# Patient Record
Sex: Male | Born: 1965 | Race: Black or African American | Hispanic: No | Marital: Single | Smoking: Current every day smoker
Health system: Southern US, Community
[De-identification: ages and names within clinical notes are randomized; demographics above are authoritative.]

## PROBLEM LIST (undated history)

## (undated) DIAGNOSIS — M545 Low back pain, unspecified: Secondary | ICD-10-CM

---

## 2005-12-02 ENCOUNTER — Emergency Department (HOSPITAL_COMMUNITY): Admission: EM | Admit: 2005-12-02 | Discharge: 2005-12-02 | Payer: Self-pay | Admitting: Emergency Medicine

## 2006-02-24 ENCOUNTER — Emergency Department (HOSPITAL_COMMUNITY): Admission: EM | Admit: 2006-02-24 | Discharge: 2006-02-24 | Payer: Self-pay | Admitting: Emergency Medicine

## 2007-03-10 ENCOUNTER — Emergency Department (HOSPITAL_COMMUNITY): Admission: EM | Admit: 2007-03-10 | Discharge: 2007-03-10 | Payer: Self-pay | Admitting: Emergency Medicine

## 2009-03-12 ENCOUNTER — Emergency Department (HOSPITAL_COMMUNITY): Admission: EM | Admit: 2009-03-12 | Discharge: 2009-03-12 | Payer: Self-pay | Admitting: Emergency Medicine

## 2009-08-24 ENCOUNTER — Emergency Department (HOSPITAL_COMMUNITY): Admission: EM | Admit: 2009-08-24 | Discharge: 2009-08-24 | Payer: Self-pay | Admitting: Emergency Medicine

## 2009-09-02 ENCOUNTER — Emergency Department (HOSPITAL_COMMUNITY): Admission: EM | Admit: 2009-09-02 | Discharge: 2009-09-02 | Payer: Self-pay | Admitting: Emergency Medicine

## 2010-03-28 LAB — BASIC METABOLIC PANEL
CO2: 22 mEq/L (ref 19–32)
Calcium: 8.8 mg/dL (ref 8.4–10.5)
Chloride: 103 mEq/L (ref 96–112)
Creatinine, Ser: 1.27 mg/dL (ref 0.4–1.5)
GFR calc non Af Amer: >60 mL/min (ref >60)
Glucose, Bld: 155 mg/dL — ABNORMAL HIGH (ref 70–99)
Potassium: 3.5 mEq/L (ref 3.5–5.1)

## 2010-03-28 LAB — URINALYSIS, ROUTINE W REFLEX MICROSCOPIC
Glucose, UA: NEGATIVE mg/dL
Nitrite: NEGATIVE
Specific Gravity, Urine: <1.005 — ABNORMAL LOW (ref 1.005–1.030)

## 2010-03-28 LAB — CBC
MCHC: 33 g/dL (ref 30.0–36.0)
RDW: 13.5 % (ref 11.5–15.5)
WBC: 4.4 10*3/uL (ref 4.0–10.5)

## 2010-03-28 LAB — URINE MICROSCOPIC-ADD ON

## 2010-03-28 LAB — DIFFERENTIAL
Monocytes Absolute: 0.6 10*3/uL (ref 0.1–1.0)
Monocytes Relative: 13 % — ABNORMAL HIGH (ref 3–12)
Neutrophils Relative %: 55 % (ref 43–77)

## 2010-03-28 LAB — RAPID URINE DRUG SCREEN, HOSP PERFORMED
Cocaine: POSITIVE — AB
Opiates: NOT DETECTED

## 2010-11-02 ENCOUNTER — Encounter: Payer: Self-pay | Admitting: Emergency Medicine

## 2010-11-02 ENCOUNTER — Emergency Department (HOSPITAL_COMMUNITY)
Admission: EM | Admit: 2010-11-02 | Discharge: 2010-11-02 | Disposition: A | Discharge status: Home / Self Care | Payer: BC Managed Care – PPO | Attending: Emergency Medicine | Admitting: Emergency Medicine

## 2010-11-02 DIAGNOSIS — K047 Periapical abscess without sinus: Secondary | ICD-10-CM | POA: Insufficient documentation

## 2010-11-02 DIAGNOSIS — F17200 Nicotine dependence, unspecified, uncomplicated: Secondary | ICD-10-CM | POA: Insufficient documentation

## 2010-11-02 MED ORDER — AMOXICILLIN 500 MG PO CAPS: 500.0000 mg | ORAL_CAPSULE | Freq: Three times a day (TID) | ORAL | Status: AC

## 2010-11-02 MED ORDER — HYDROCODONE-ACETAMINOPHEN 5-325 MG PO TABS
1.0000 | ORAL_TABLET | ORAL | Status: AC | PRN
Start: 2010-11-02 — End: 2010-11-12

## 2010-11-02 NOTE — ED Provider Notes (Signed)
History     CSN: [id number] Arrival date & time: 11/02/2010  7:59 AM   First MD Initiated Contact with Patient 11/02/10 [phone]      Chief Complaint  Patient presents with  . Dental Pain    (Consider location/radiation/quality/duration/timing/severity/associated sxs/prior treatment) Patient is a 45 y.o. male presenting with tooth pain. The history is provided by the patient.  Dental PainThe primary symptoms include mouth pain and fever. Primary symptoms do not include headaches, shortness of breath or sore throat. The symptoms began 3 to 5 days ago. The symptoms are worsening. The symptoms are new. The symptoms occur constantly.  Mouth pain began 3 - 5 days ago. Mouth pain occurs constantly. Affected locations include: teeth and gum(s). At its highest the mouth pain was at 9/10. The mouth pain is currently at 9/10.  Additional symptoms include: gum swelling, gum tenderness and facial swelling. Additional symptoms do not include: trouble swallowing, pain with swallowing, drooling, ear pain and swollen glands.    History reviewed. No pertinent past medical history.  History reviewed. No pertinent past surgical history.  History reviewed. No pertinent family history.  History  Substance Use Topics  . Smoking status: Current Everyday Smoker -- 0.5 packs/day for 25 years    Types: Cigarettes  . Smokeless tobacco: Never Used  . Alcohol Use: 1.8 oz/week    3 Cans of beer per week      Review of Systems  Constitutional: Positive for fever.  HENT: Positive for facial swelling and dental problem. Negative for ear pain, congestion, sore throat, drooling, trouble swallowing, neck pain and voice change.   Eyes: Negative.   Respiratory: Negative for chest tightness and shortness of breath.   Cardiovascular: Negative for chest pain.  Gastrointestinal: Negative for nausea and abdominal pain.  Genitourinary: Negative.   Musculoskeletal: Negative for joint swelling and arthralgias.  Skin:  Negative.  Negative for rash and wound.  Neurological: Negative for dizziness, weakness, light-headedness, numbness and headaches.  Hematological: Negative.   Psychiatric/Behavioral: Negative.     Allergies  Review of patient's allergies indicates no known allergies.  Home Medications  No current outpatient prescriptions on file.  BP 133/82  Pulse 83  Temp(Src) 98.7 F (37.1 C) (Oral)  Resp 16  Ht 6\' 2"  (1.88 m)  Wt 200 lb (90.719 kg)  BMI 25.68 kg/m2  SpO2 99%  Physical Exam  Constitutional: He is oriented to person, place, and time. He appears well-developed and well-nourished. No distress.  HENT:  Head: Normocephalic and atraumatic.  Right Ear: Tympanic membrane and external ear normal.  Left Ear: Tympanic membrane and external ear normal.  Mouth/Throat: Oropharynx is clear and moist and mucous membranes are normal. No oral lesions. Dental abscesses present.    Eyes: Conjunctivae are normal.  Neck: Normal range of motion. Neck supple.  Cardiovascular: Normal rate and normal heart sounds.   Pulmonary/Chest: Effort normal.  Abdominal: He exhibits no distension.  Musculoskeletal: Normal range of motion.  Lymphadenopathy:    He has no cervical adenopathy.  Neurological: He is alert and oriented to person, place, and time.  Skin: Skin is warm and dry. No erythema.  Psychiatric: He has a normal mood and affect.    ED Course  Procedures (including critical care time)  Labs Reviewed - No data to display No results found.   No diagnosis found.    MDM  Patient is scheduled to see a dentist in 1 week in [place].  PCN,  Hydrocodone.  Candis Musa, PA 11/02/10 [phone]

## 2010-11-02 NOTE — ED Provider Notes (Signed)
Shelda Jakes, MD Medical screening examination/treatment/procedure(s) were performed by non-physician practitioner and as supervising physician I was immediately available for consultation/collaboration.  Shelda Jakes, MD 11/02/10 [phone]

## 2010-11-02 NOTE — ED Notes (Signed)
Patient c/o right upper abscessed tooth since Wednesday that has progressively gotten worse. Right jaw swelling noted. Per patient has dentist appointment on Saturday in [place].

## 2012-01-12 ENCOUNTER — Emergency Department (HOSPITAL_COMMUNITY)
Admission: EM | Admit: 2012-01-12 | Discharge: 2012-01-12 | Disposition: A | Discharge status: Home / Self Care | Payer: BC Managed Care – PPO | Attending: Emergency Medicine | Admitting: Emergency Medicine

## 2012-01-12 ENCOUNTER — Encounter (HOSPITAL_COMMUNITY): Payer: Self-pay | Admitting: *Deleted

## 2012-01-12 DIAGNOSIS — R350 Frequency of micturition: Secondary | ICD-10-CM | POA: Insufficient documentation

## 2012-01-12 DIAGNOSIS — F17200 Nicotine dependence, unspecified, uncomplicated: Secondary | ICD-10-CM | POA: Insufficient documentation

## 2012-01-12 DIAGNOSIS — M545 Low back pain, unspecified: Secondary | ICD-10-CM | POA: Insufficient documentation

## 2012-01-12 LAB — URINALYSIS, ROUTINE W REFLEX MICROSCOPIC
Bilirubin Urine: NEGATIVE
Leukocytes, UA: NEGATIVE
Nitrite: NEGATIVE
Protein, ur: NEGATIVE mg/dL
Specific Gravity, Urine: 1.025 (ref 1.005–1.030)
Urobilinogen, UA: 0.2 mg/dL (ref 0.0–1.0)
pH: 6 (ref 5.0–8.0)

## 2012-01-12 LAB — URINE MICROSCOPIC-ADD ON

## 2012-01-12 LAB — GLUCOSE, CAPILLARY: Glucose-Capillary: 122 mg/dL — ABNORMAL HIGH (ref 70–99)

## 2012-01-12 NOTE — ED Notes (Signed)
Urinary frequency x 1-2 months.  Denies burning, denies pain with urination.  Denies penile discharge.  States he wants to be checked for enlarged prostate.

## 2012-01-12 NOTE — ED Notes (Signed)
Pt presents with c/o frequent urine with urgency. Pt states voiding every 1.5, 2 hrs and needs to "get up during the night". Denies pain, n/v and fever. NAD noted.

## 2012-01-12 NOTE — ED Provider Notes (Signed)
History  This chart was scribed for Glynn Octave, MD by Shari Heritage, ED Scribe. The patient was seen in room APA01/APA01. Patient's care was started at 1005.  CSN: [id number]  Arrival date & time 01/12/12  4098   First MD Initiated Contact with Patient 01/12/12 1005      Chief Complaint  Patient presents with  . Urinary Frequency     The history is provided by the patient. No language interpreter was used.    HPI Comments: Hayden Anderson is a 46 y.o. male who presents to the Emergency Department complaining of increased urinary frequency onset 1.5 months ago. Patient also reports a constant, non-radiating, dull right lower back pain that has been present for the past several months. No weakness, numbness, tingling. Patient denies fever, dysuria, hematuria, abdominal pain, vomiting, testicular pain, or penile discharge. Patient says that he has been voiding a normal amount of urine each time, but voids every 1.5 hours even at night interrupting his sleep. He says that he hasn't had matching fluid intake increases and states he has even reduced his fluid intake with no effect on frequency. Patient has no known personal or family history of diabetes. He denies any other chronic medical conditions or significant surgical history. Patient is a current every day smoker.   No family history on file.  History  Substance Use Topics  . Smoking status: Current Every Day Smoker -- 0.5 packs/day for 25 years    Types: Cigarettes  . Smokeless tobacco: Never Used  . Alcohol Use: 1.8 oz/week    3 Cans of beer per week     Comment: daily      Review of Systems A complete 10 system review of systems was obtained and all systems are negative except as noted in the HPI and PMH.   Allergies  Review of patient's allergies indicates no known allergies.  Home Medications  No current outpatient prescriptions on file.  BP 136/80  Pulse 86  Temp 97.5 F (36.4 C) (Oral)  Resp 20  SpO2  100%  Physical Exam  Constitutional: He is oriented to person, place, and time. He appears well-developed and well-nourished. No distress.  HENT:  Head: Normocephalic.  Eyes: Conjunctivae normal are normal.  Neck: Neck supple.  Cardiovascular: Normal rate and regular rhythm.   No murmur heard. Pulmonary/Chest: Breath sounds normal. No respiratory distress. He has no wheezes. He has no rales.  Abdominal: Soft. Bowel sounds are normal. He exhibits no distension. There is no tenderness. There is no rebound and no guarding.  Genitourinary:       Patient refuses genital or prostate exam.  Musculoskeletal: Normal range of motion. He exhibits no edema and no tenderness.  Neurological: He is alert and oriented to person, place, and time. No cranial nerve deficit. He exhibits normal muscle tone. Coordination normal.  Skin: Skin is warm and dry.  Psychiatric: He has a normal mood and affect. His behavior is normal.    ED Course  Procedures (including critical care time) DIAGNOSTIC STUDIES: Oxygen Saturation is 100% on room air, normal by my interpretation.    COORDINATION OF CARE: 10:15 AM- Patient informed of current plan for treatment and evaluation and agrees with plan at this time.    Labs Reviewed  URINALYSIS, ROUTINE W REFLEX MICROSCOPIC - Abnormal; Notable for the following:    Hgb urine dipstick TRACE (*)     All other components within normal limits  GLUCOSE, CAPILLARY - Abnormal; Notable for the following:  Glucose-Capillary 122 (*)     All other components within normal limits  URINE MICROSCOPIC-ADD ON    1. Urinary frequency       MDM  Complains of urinary frequency for the past 2 months. States that his normal amount of urine and feels like he is empyting his bladder completely. Denies any dysuria, hematuria, penile discharge, testicular pain, abdominal pain nausea or vomiting.  Patient refuses genital or prostate exam. Abdomen soft and nontender.  I explained  this is a necessary part of his evaluation and his evaluation is incomplete without it.  He continues to refuse.   UA negative. CBG 122. He states he just ate sausage and pancakes.  Given PCP and urology referral.    I personally performed the services described in this documentation, which was scribed in my presence. The recorded information has been reviewed and is accurate.    Glynn Octave, MD 01/12/12 1045

## 2012-01-12 NOTE — ED Notes (Signed)
Pt refusing rectal, teste exam at this time. Wife trying to encourage the pt to allow MD to examine the pt.

## 2012-01-12 NOTE — ED Notes (Signed)
MD at bedside. 

## 2012-09-28 ENCOUNTER — Ambulatory Visit: Payer: Self-pay | Admitting: Physician Assistant

## 2012-12-26 ENCOUNTER — Emergency Department (HOSPITAL_COMMUNITY)
Admission: EM | Admit: 2012-12-26 | Discharge: 2012-12-26 | Disposition: A | Discharge status: Home / Self Care | Payer: BC Managed Care – PPO | Attending: Emergency Medicine | Admitting: Emergency Medicine

## 2012-12-26 ENCOUNTER — Encounter (HOSPITAL_COMMUNITY): Payer: Self-pay | Admitting: Emergency Medicine

## 2012-12-26 DIAGNOSIS — M25561 Pain in right knee: Secondary | ICD-10-CM

## 2012-12-26 DIAGNOSIS — M25569 Pain in unspecified knee: Secondary | ICD-10-CM | POA: Insufficient documentation

## 2012-12-26 DIAGNOSIS — F17200 Nicotine dependence, unspecified, uncomplicated: Secondary | ICD-10-CM | POA: Insufficient documentation

## 2012-12-26 MED ORDER — TRAMADOL HCL 50 MG PO TABS: 50.0000 mg | ORAL_TABLET | Freq: Four times a day (QID) | ORAL | Status: DC | PRN

## 2012-12-26 MED ORDER — MELOXICAM 7.5 MG PO TABS: 7.5000 mg | ORAL_TABLET | Freq: Two times a day (BID) | ORAL | Status: DC

## 2012-12-26 NOTE — ED Notes (Signed)
Pt c/o bilateral knee pain for last few weeks, denies any injury

## 2012-12-28 NOTE — ED Provider Notes (Signed)
CSN: [id number]     Arrival date & time 12/26/12  1655 History   First MD Initiated Contact with Patient 12/26/12 1742     Chief Complaint  Patient presents with  . Knee Pain   (Consider location/radiation/quality/duration/timing/severity/associated sxs/prior Treatment) Patient is a 47 y.o. male presenting with knee pain. The history is provided by the patient.  Knee Pain Location:  Knee Time since incident:  2 weeks Injury: no   Knee location:  L knee and R knee Pain details:    Quality:  Aching and throbbing   Radiates to:  Does not radiate   Severity:  Moderate   Onset quality:  Gradual   Timing:  Constant   Progression:  Worsening Chronicity:  Recurrent Dislocation: no   Foreign body present:  No foreign bodies Prior injury to area:  No Relieved by:  Rest Worsened by:  Activity, bearing weight and flexion Ineffective treatments:  None tried Associated symptoms: stiffness   Associated symptoms: no back pain, no decreased ROM, no fatigue, no fever, no itching, no muscle weakness, no neck pain, no numbness, no swelling and no tingling   Risk factors: no obesity and no recent illness     History reviewed. No pertinent past medical history. History reviewed. No pertinent past surgical history. History reviewed. No pertinent family history. History  Substance Use Topics  . Smoking status: Current Every Day Smoker -- 0.50 packs/day for 25 years    Types: Cigarettes  . Smokeless tobacco: Never Used  . Alcohol Use: 1.8 oz/week    3 Cans of beer per week     Comment: daily    Review of Systems  Constitutional: Negative for fever, chills and fatigue.  Respiratory: Negative for chest tightness.   Genitourinary: Negative for dysuria, flank pain and difficulty urinating.  Musculoskeletal: Positive for arthralgias and stiffness. Negative for back pain, gait problem, joint swelling, neck pain and neck stiffness.  Skin: Negative for color change, itching and wound.  All other  systems reviewed and are negative.    Allergies  Review of patient's allergies indicates no known allergies.  Home Medications   Current Outpatient Rx  Name  Route  Sig  Dispense  Refill  . meloxicam (MOBIC) 7.5 MG tablet   Oral   Take 1 tablet (7.5 mg total) by mouth 2 (two) times daily.   30 tablet   0   . traMADol (ULTRAM) 50 MG tablet   Oral   Take 1 tablet (50 mg total) by mouth every 6 (six) hours as needed.   20 tablet   0    BP 128/73  Pulse 67  Temp(Src) 98.2 F (36.8 C) (Oral)  Resp 20  Ht 6\' 3"  (1.905 m)  Wt 200 lb (90.719 kg)  BMI 25.00 kg/m2  SpO2 100% Physical Exam  Nursing note and vitals reviewed. Constitutional: He is oriented to person, place, and time. He appears well-developed and well-nourished. No distress.  HENT:  Head: Normocephalic.  Neck: Normal range of motion. Neck supple.  Cardiovascular: Normal rate, regular rhythm, normal heart sounds and intact distal pulses.   Pulmonary/Chest: Effort normal and breath sounds normal. No respiratory distress. He exhibits no tenderness.  Musculoskeletal: He exhibits tenderness. He exhibits no edema.  ttp of the bilateral knees.  Mild patellar crepitus right > left.  No erythema, effusion, or step-off deformity.  DP pulse brisk, distal sensation intact. Calf is soft and NT. Pt has full ROM.    Lymphadenopathy:    He has  no cervical adenopathy.  Neurological: He is alert and oriented to person, place, and time. He exhibits normal muscle tone. Coordination normal.  Skin: Skin is warm and dry. No erythema.    ED Course  Procedures (including critical care time) Labs Review Labs Reviewed - No data to display Imaging Review No results found.  EKG Interpretation   None       MDM   1. Knee pain, bilateral     Bilateral chronic knee pain w/o erythema, effusion or obvious ligament instability.  No concerning symptoms for septic joint.  Pt agrees to rest, NSAID therapy and orthopedic f/u if sx's  not improving.  Appears stable for discharge.  Ambulates with a steady gait.     Cashtyn Pouliot L. Trisha Mangle, PA-C 12/28/12 1954

## 2012-12-28 NOTE — ED Provider Notes (Signed)
Medical screening examination/treatment/procedure(s) were performed by non-physician practitioner and as supervising physician I was immediately available for consultation/collaboration.  EKG Interpretation   None         Zan Orlick L Aunna Snooks, MD 12/28/12 2318 

## 2013-05-08 ENCOUNTER — Emergency Department (HOSPITAL_COMMUNITY): Payer: PRIVATE HEALTH INSURANCE

## 2013-05-08 ENCOUNTER — Emergency Department (HOSPITAL_COMMUNITY)
Admission: EM | Admit: 2013-05-08 | Discharge: 2013-05-08 | Disposition: A | Discharge status: Home / Self Care | Payer: PRIVATE HEALTH INSURANCE | Attending: Emergency Medicine | Admitting: Emergency Medicine

## 2013-05-08 ENCOUNTER — Encounter (HOSPITAL_COMMUNITY): Payer: Self-pay | Admitting: Emergency Medicine

## 2013-05-08 DIAGNOSIS — R42 Dizziness and giddiness: Secondary | ICD-10-CM | POA: Insufficient documentation

## 2013-05-08 DIAGNOSIS — R079 Chest pain, unspecified: Secondary | ICD-10-CM

## 2013-05-08 DIAGNOSIS — R0789 Other chest pain: Secondary | ICD-10-CM | POA: Insufficient documentation

## 2013-05-08 DIAGNOSIS — Z87891 Personal history of nicotine dependence: Secondary | ICD-10-CM | POA: Insufficient documentation

## 2013-05-08 DIAGNOSIS — R110 Nausea: Secondary | ICD-10-CM | POA: Insufficient documentation

## 2013-05-08 LAB — CBC WITH DIFFERENTIAL/PLATELET
BASOS ABS: 0 10*3/uL (ref 0.0–0.1)
Basophils Relative: 1 % (ref 0–1)
EOS PCT: 5 % (ref 0–5)
Eosinophils Absolute: 0.2 10*3/uL (ref 0.0–0.7)
HEMATOCRIT: 42.2 % (ref 39.0–52.0)
HEMOGLOBIN: 14.3 g/dL (ref 13.0–17.0)
LYMPHS ABS: 2.3 10*3/uL (ref 0.7–4.0)
LYMPHS PCT: 47 % — AB (ref 12–46)
MCH: 30.4 pg (ref 26.0–34.0)
MCHC: 33.9 g/dL (ref 30.0–36.0)
MCV: 89.6 fL (ref 78.0–100.0)
MONO ABS: 0.6 10*3/uL (ref 0.1–1.0)
Monocytes Relative: 13 % — ABNORMAL HIGH (ref 3–12)
NEUTROS ABS: 1.6 10*3/uL — AB (ref 1.7–7.7)
Neutrophils Relative %: 34 % — ABNORMAL LOW (ref 43–77)
PLATELETS: 186 10*3/uL (ref 150–400)
RBC: 4.71 MIL/uL (ref 4.22–5.81)
RDW: 13.1 % (ref 11.5–15.5)
WBC: 4.8 10*3/uL (ref 4.0–10.5)

## 2013-05-08 LAB — BASIC METABOLIC PANEL
BUN: 14 mg/dL (ref 6–23)
CHLORIDE: 99 meq/L (ref 96–112)
CO2: 29 mEq/L (ref 19–32)
CREATININE: 1.21 mg/dL (ref 0.50–1.35)
Calcium: 9.8 mg/dL (ref 8.4–10.5)
GFR calc Af Amer: 80 mL/min — ABNORMAL LOW (ref >90)
GFR, EST NON AFRICAN AMERICAN: 69 mL/min — AB (ref >90)
GLUCOSE: 106 mg/dL — AB (ref 70–99)
Potassium: 4.1 mEq/L (ref 3.7–5.3)
Sodium: 139 mEq/L (ref 137–147)

## 2013-05-08 LAB — TROPONIN I

## 2013-05-08 MED ORDER — LORAZEPAM 0.5 MG PO TABS: 1.0000 mg | ORAL_TABLET | Freq: Three times a day (TID) | ORAL | Status: DC | PRN

## 2013-05-08 MED ORDER — LORAZEPAM 1 MG PO TABS
0.5000 mg | ORAL_TABLET | Freq: Once | ORAL | Status: AC
  Administered 2013-05-08: 0.5 mg via ORAL
  Filled 2013-05-08: qty 1

## 2013-05-08 NOTE — ED Notes (Signed)
Pt states he has been having chest tightness for over a week, that comes & goes. NAD noted.

## 2013-05-08 NOTE — ED Notes (Signed)
Intermittent cp x 2 weeks with some lightheadedness today.

## 2013-05-08 NOTE — Discharge Instructions (Signed)
Follow up with a family md in one week.   You can see Dr. Regino SchultzeMcGough or Dr. Randel PiggFagon or Dr.  Lodema HongSimpson or another family md

## 2013-05-08 NOTE — ED Provider Notes (Signed)
CSN: [id number]     Arrival date & time 05/08/13  1823 History  This chart was scribed for Hayden LennertJoseph L Aydien Majette, MD by Beverly MilchJ Harrison Collins, ED Scribe. This patient was seen in room APA04/APA04 and the patient's care was started at 10:28 PM.    Chief Complaint  Patient presents with  . Chest Pain    Patient is a 48 y.o. male presenting with chest pain. The history is provided by the patient. No language interpreter was used.  Chest Pain Pain location:  Substernal area Pain quality: tightness   Pain radiates to:  Does not radiate Pain radiates to the back: no   Pain severity:  Moderate Onset quality:  Gradual Duration:  2 weeks Timing:  Intermittent Progression:  Worsening Chronicity:  New Context: movement and at rest   Associated symptoms: nausea   Associated symptoms: no abdominal pain, no back pain, no cough, no fatigue, no headache and no shortness of breath   Risk factors: smoking   He reports he's had a lot of gas lately. Pt denies a PCP.   History reviewed. No pertinent past medical history. History reviewed. No pertinent past surgical history. No family history on file. History  Substance Use Topics  . Smoking status: Former Smoker -- 0.50 packs/day for 25 years    Types: Cigarettes    Quit date: 05/06/2013  . Smokeless tobacco: Never Used  . Alcohol Use: Yes     Comment: occasionally    Review of Systems  Constitutional: Negative for appetite change and fatigue.  HENT: Negative for congestion, ear discharge and sinus pressure.   Eyes: Negative for discharge.  Respiratory: Negative for cough and shortness of breath.   Cardiovascular: Positive for chest pain.  Gastrointestinal: Positive for nausea. Negative for abdominal pain and diarrhea.  Genitourinary: Negative for frequency and hematuria.  Musculoskeletal: Negative for back pain.  Skin: Negative for rash.  Neurological: Positive for light-headedness. Negative for seizures and headaches.  Psychiatric/Behavioral:  Negative for hallucinations.    Allergies  Review of patient's allergies indicates no known allergies.   Home Medications    Prior to Admission medications   Medication Sig Start Date End Date Taking? Authorizing Provider  HYDROcodone-acetaminophen (NORCO) 7.5-325 MG per tablet Take 1 tablet by mouth every 6 (six) hours as needed. For pain 04/17/13  Yes Historical Provider, MD    Triage Vitals: BP 129/90  Pulse 59  Temp(Src) 97.8 F (36.6 C) (Oral)  Resp 16  Ht 6\' 1"  (1.854 m)  Wt 180 lb (81.647 kg)  BMI 23.75 kg/m2  SpO2 100%   Physical Exam  Nursing note and vitals reviewed. Constitutional: He is oriented to person, place, and time. He appears well-developed.  HENT:  Head: Normocephalic.  Eyes: Conjunctivae and EOM are normal. No scleral icterus.  Neck: Neck supple. No thyromegaly present.  Cardiovascular: Normal rate and regular rhythm.  Exam reveals no gallop and no friction rub.   No murmur heard. Pulmonary/Chest: No stridor. He has no wheezes. He has no rales. He exhibits no tenderness.  Abdominal: He exhibits no distension. There is no tenderness. There is no rebound.  Musculoskeletal: Normal range of motion. He exhibits no edema.  Lymphadenopathy:    He has no cervical adenopathy.  Neurological: He is oriented to person, place, and time. He exhibits normal muscle tone. Coordination normal.  Skin: No rash noted. No erythema.  Psychiatric: He has a normal mood and affect. His behavior is normal.    ED Course  Procedures (including  critical care time)   DIAGNOSTIC STUDIES: Oxygen Saturation is 100% on RA, normal by my interpretation.     COORDINATION OF CARE: 10:31 PM- Pt advised of plan for treatment and pt agrees.  Labs Review Labs Reviewed  CBC WITH DIFFERENTIAL - Abnormal; Notable for the following:    Neutrophils Relative % 34 (*)    Neutro Abs 1.6 (*)    Lymphocytes Relative 47 (*)    Monocytes Relative 13 (*)    All other components within  normal limits  BASIC METABOLIC PANEL - Abnormal; Notable for the following:    Glucose, Bld 106 (*)    GFR calc non Af Amer 69 (*)    GFR calc Af Amer 80 (*)    All other components within normal limits  TROPONIN I    Imaging Review Dg Chest 2 View  05/08/2013   CLINICAL DATA:  Several week history of chest pain without history of injury. History of previous tobacco use quitting 1 week ago  EXAM: CHEST  2 VIEW  COMPARISON:  None.  FINDINGS: The lungs are mildly hyperinflated with hemidiaphragm flattening. There is no pneumothorax or pneumomediastinum or pleural effusion. No abnormal pulmonary parenchymal nodules are demonstrated. There is no interstitial or alveolar pneumonia. The cardiac silhouette is normal in size. The pulmonary vascularity is not engorged. The trachea is midline. The observed portions of the bony thorax appear normal.  IMPRESSION: There is hyperinflation consistent with COPD. There is no evidence of pneumonia nor CHF nor other active cardiopulmonary disease.   Electronically Signed   By: David  SwazilandJordan   On: 05/08/2013 19:39     EKG Interpretation None      MDM   Final diagnoses:  None   The chart was scribed for me under my direct supervision.  I personally performed the history, physical, and medical decision making and all procedures in the evaluation of this patient.Hayden Anderson.   Slate Debroux L Oriah Leinweber, MD 05/08/13 [phone]

## 2013-05-08 NOTE — ED Notes (Signed)
Pt alert & oriented x4, stable gait. Patient given discharge instructions, paperwork & prescription(s). Patient  instructed to stop at the registration desk to finish any additional paperwork. Patient verbalized understanding. Pt left department w/ no further questions. 

## 2013-07-15 ENCOUNTER — Encounter (HOSPITAL_COMMUNITY): Payer: Self-pay | Admitting: Emergency Medicine

## 2013-07-15 ENCOUNTER — Emergency Department (HOSPITAL_COMMUNITY)
Admission: EM | Admit: 2013-07-15 | Discharge: 2013-07-15 | Disposition: A | Discharge status: Home / Self Care | Payer: PRIVATE HEALTH INSURANCE | Attending: Emergency Medicine | Admitting: Emergency Medicine

## 2013-07-15 DIAGNOSIS — Y929 Unspecified place or not applicable: Secondary | ICD-10-CM | POA: Insufficient documentation

## 2013-07-15 DIAGNOSIS — X58XXXA Exposure to other specified factors, initial encounter: Secondary | ICD-10-CM | POA: Insufficient documentation

## 2013-07-15 DIAGNOSIS — Z79899 Other long term (current) drug therapy: Secondary | ICD-10-CM | POA: Insufficient documentation

## 2013-07-15 DIAGNOSIS — S61412A Laceration without foreign body of left hand, initial encounter: Secondary | ICD-10-CM

## 2013-07-15 DIAGNOSIS — F17200 Nicotine dependence, unspecified, uncomplicated: Secondary | ICD-10-CM | POA: Insufficient documentation

## 2013-07-15 DIAGNOSIS — S61409A Unspecified open wound of unspecified hand, initial encounter: Secondary | ICD-10-CM | POA: Insufficient documentation

## 2013-07-15 DIAGNOSIS — Z23 Encounter for immunization: Secondary | ICD-10-CM | POA: Insufficient documentation

## 2013-07-15 DIAGNOSIS — Y939 Activity, unspecified: Secondary | ICD-10-CM | POA: Insufficient documentation

## 2013-07-15 MED ORDER — TETANUS-DIPHTH-ACELL PERTUSSIS 5-2.5-18.5 LF-MCG/0.5 IM SUSP
0.5000 mL | Freq: Once | INTRAMUSCULAR | Status: AC
  Administered 2013-07-15: 0.5 mL via INTRAMUSCULAR
  Filled 2013-07-15: qty 0.5

## 2013-07-15 MED ORDER — LIDOCAINE-EPINEPHRINE (PF) 1 %-1:200000 IJ SOLN
10.0000 mL | Freq: Once | INTRAMUSCULAR | Status: AC
  Administered 2013-07-15: 10 mL via INTRADERMAL

## 2013-07-15 NOTE — Discharge Instructions (Signed)
Sutures out in 7 days.   Laceration Care, Adult A laceration is a cut that goes through all layers of the skin. The cut goes into the tissue beneath the skin. HOME CARE For stitches (sutures) or staples:  Keep the cut clean and dry.  If you have a bandage (dressing), change it at least once a day. Change the bandage if it gets wet or dirty, or as told by your doctor.  Wash the cut with soap and water 2 times a day. Rinse the cut with water. Pat it dry with a clean towel.  Put a thin layer of medicated cream on the cut as told by your doctor.  You may shower after the first 24 hours. Do not soak the cut in water until the stitches are removed.  Only take medicines as told by your doctor.  Have your stitches or staples removed as told by your doctor. For skin adhesive strips:  Keep the cut clean and dry.  Do not get the strips wet. You may take a bath, but be careful to keep the cut dry.  If the cut gets wet, pat it dry with a clean towel.  The strips will fall off on their own. Do not remove the strips that are still stuck to the cut. For wound glue:  You may shower or take baths. Do not soak or scrub the cut. Do not swim. Avoid heavy sweating until the glue falls off on its own. After a shower or bath, pat the cut dry with a clean towel.  Do not put medicine on your cut until the glue falls off.  If you have a bandage, do not put tape over the glue.  Avoid lots of sunlight or tanning lamps until the glue falls off. Put sunscreen on the cut for the first year to reduce your scar.  The glue will fall off on its own. Do not pick at the glue. You may need a tetanus shot if:  You cannot remember when you had your last tetanus shot.  You have never had a tetanus shot. If you need a tetanus shot and you choose not to have one, you may get tetanus. Sickness from tetanus can be serious. GET HELP RIGHT AWAY IF:   Your pain does not get better with medicine.  Your arm, hand,  leg, or foot loses feeling (numbness) or changes color.  Your cut is bleeding.  Your joint feels weak, or you cannot use your joint.  You have painful lumps on your body.  Your cut is red, puffy (swollen), or painful.  You have a red line on the skin near the cut.  You have yellowish-white fluid (pus) coming from the cut.  You have a fever.  You have a bad smell coming from the cut or bandage.  Your cut breaks open before or after stitches are removed.  You notice something coming out of the cut, such as wood or glass.  You cannot move a finger or toe. MAKE SURE YOU:   Understand these instructions.  Will watch your condition.  Will get help right away if you are not doing well or get worse. Document Released: 06/17/2007 Document Revised: 03/23/2011 Document Reviewed: 06/24/2010 [facility] LPExit[facility] Patient Information 2015 [employer], [place]. This information is not intended to replace advice given to you by your health care provider. Make sure you discuss any questions you have with your health care provider.

## 2013-07-15 NOTE — ED Notes (Signed)
Puncture/laceration to top of left hand=

## 2013-07-15 NOTE — ED Provider Notes (Signed)
CSN: [id number]     Arrival date & time 07/15/13  0236 History   First MD Initiated Contact with Patient 07/15/13 0322     No chief complaint on file.    (Consider location/radiation/quality/duration/timing/severity/associated sxs/prior Treatment) Patient is a 48 y.o. male presenting with skin laceration.  Laceration Location:  Hand Hand laceration location:  L hand Length (cm):  3 Depth:  Through dermis Bleeding: controlled with pressure   Time since incident:  2 hours Laceration mechanism:  Unable to specify (Patient fell and unclear what he cut it on) Pain details:    Quality:  Sharp   Severity:  Mild   Timing:  Constant   Progression:  Partially resolved Foreign body present:  Unable to specify Relieved by:  Nothing Worsened by:  Nothing tried Ineffective treatments:  None tried Tetanus status:  Out of date   History reviewed. No pertinent past medical history. History reviewed. No pertinent past surgical history. No family history on file. History  Substance Use Topics  . Smoking status: Current Every Day Smoker -- 0.50 packs/day for 25 years    Types: Cigarettes    Last Attempt to Quit: 05/06/2013  . Smokeless tobacco: Never Used  . Alcohol Use: Yes     Comment: occasionally    Review of Systems  All other systems reviewed and are negative.     Allergies  Review of patient's allergies indicates no known allergies.  Home Medications   Prior to Admission medications   Medication Sig Start Date End Date Taking? Authorizing Provider  HYDROcodone-acetaminophen (NORCO) 7.5-325 MG per tablet Take 1 tablet by mouth every 6 (six) hours as needed. For pain 04/17/13   Historical Provider, MD  LORazepam (ATIVAN) 0.5 MG tablet Take 2 tablets (1 mg total) by mouth every 8 (eight) hours as needed (chest tightness). 05/08/13   Benny LennertJoseph L Zammit, MD   BP 140/91  Pulse 87  Temp(Src) 97.9 F (36.6 C) (Oral)  Resp 16  Ht 6\' 2"  (1.88 m)  Wt 180 lb (81.647 kg)  BMI 23.10  kg/m2  SpO2 100% Physical Exam  Nursing note and vitals reviewed. Constitutional: He appears well-developed and well-nourished.  HENT:  Head: Normocephalic and atraumatic.  Eyes: Pupils are equal, round, and reactive to light.  Musculoskeletal:       Hands: 2 discrete laceration 2, 1 cm respectively Neurovascularly intact distal to injury.  Full arom. No tendons visualized.  No foreign bodies seen. 2 point discrimination intact all fingers.     ED Course  LACERATION REPAIR Date/Time: 07/15/2013 3:42 AM Performed by: Hilario QuarryAY, Danika Kluender S Authorized by: Hilario QuarryAY, Miliana Gangwer S Consent: Verbal consent obtained. Risks and benefits: risks, benefits and alternatives were discussed Patient identity confirmed: verbally with patient Time out: Immediately prior to procedure a "time out" was called to verify the correct patient, procedure, equipment, support staff and site/side marked as required. Body area: upper extremity Location details: left hand Laceration length: 2 cm Foreign bodies: no foreign bodies Tendon involvement: none Nerve involvement: none Anesthesia: local infiltration Local anesthetic: lidocaine 1% with epinephrine Anesthetic total: 2 ml Irrigation solution: saline Irrigation method: syringe Amount of cleaning: extensive Debridement: none Degree of undermining: none Skin closure: 4-0 Prolene Number of sutures: 3 Technique: simple Approximation: close Dressing: 4x4 sterile gauze Patient tolerance: Patient tolerated the procedure well with no immediate complications.   (including critical care time) Labs Review Labs Reviewed - No data to display  Imaging Review No results found.   EKG Interpretation None  MDM   Final diagnoses:  Hand laceration, left, initial encounter    Plan tdap.  Patient cautioned regarding needs for return.  Sutures out 7 days.    Hilario Quarryanielle S Belma Dyches, MD 07/16/13 [phone]

## 2013-08-29 ENCOUNTER — Encounter (HOSPITAL_COMMUNITY): Payer: Self-pay | Admitting: Emergency Medicine

## 2013-08-29 ENCOUNTER — Emergency Department (HOSPITAL_COMMUNITY)
Admission: EM | Admit: 2013-08-29 | Discharge: 2013-08-29 | Disposition: A | Discharge status: Home / Self Care | Payer: PRIVATE HEALTH INSURANCE | Attending: Emergency Medicine | Admitting: Emergency Medicine

## 2013-08-29 DIAGNOSIS — S335XXA Sprain of ligaments of lumbar spine, initial encounter: Secondary | ICD-10-CM | POA: Insufficient documentation

## 2013-08-29 DIAGNOSIS — Y9389 Activity, other specified: Secondary | ICD-10-CM | POA: Insufficient documentation

## 2013-08-29 DIAGNOSIS — X503XXA Overexertion from repetitive movements, initial encounter: Secondary | ICD-10-CM | POA: Insufficient documentation

## 2013-08-29 DIAGNOSIS — IMO0002 Reserved for concepts with insufficient information to code with codable children: Secondary | ICD-10-CM | POA: Insufficient documentation

## 2013-08-29 DIAGNOSIS — Y9289 Other specified places as the place of occurrence of the external cause: Secondary | ICD-10-CM | POA: Insufficient documentation

## 2013-08-29 DIAGNOSIS — F17200 Nicotine dependence, unspecified, uncomplicated: Secondary | ICD-10-CM | POA: Insufficient documentation

## 2013-08-29 DIAGNOSIS — S39012A Strain of muscle, fascia and tendon of lower back, initial encounter: Secondary | ICD-10-CM

## 2013-08-29 MED ORDER — NAPROXEN 500 MG PO TABS: 500.0000 mg | ORAL_TABLET | Freq: Two times a day (BID) | ORAL | Status: DC

## 2013-08-29 MED ORDER — CYCLOBENZAPRINE HCL 10 MG PO TABS: 10.0000 mg | ORAL_TABLET | Freq: Three times a day (TID) | ORAL | Status: DC | PRN

## 2013-08-29 MED ORDER — HYDROCODONE-ACETAMINOPHEN 5-325 MG PO TABS: ORAL_TABLET | ORAL | Status: DC

## 2013-08-29 NOTE — Discharge Instructions (Signed)

## 2013-08-29 NOTE — ED Notes (Signed)
Pt c/o lower back pain that started after helping his dad unload a lawn mower yesterday, states "i felt it pull when it happened" pt ambulatory to tx room with no problems,

## 2013-08-31 NOTE — ED Provider Notes (Signed)
CSN: [id number]     Arrival date & time 08/29/13  [id number] History   First MD Initiated Contact with Patient 08/29/13 [phone]     Chief Complaint  Patient presents with  . Back Pain   Hayden Anderson is a 48 y.o. male who presents to the Emergency Department complaining of diffuse lower back pain after helping someone lift a lawnmower.  States he felt a "pull" to his back and has had pain since that time.    (Consider location/radiation/quality/duration/timing/severity/associated sxs/prior Treatment) Patient is a 48 y.o. male presenting with back pain. The history is provided by the patient.  Back Pain Location:  Lumbar spine Quality:  Aching Radiates to:  Does not radiate Pain severity:  Moderate Pain is:  Same all the time Onset quality:  Sudden Duration:  1 day Timing:  Constant Progression:  Unchanged Chronicity:  Recurrent Context: lifting heavy objects, recent injury and twisting   Relieved by:  Bed rest Worsened by:  Bending, twisting and movement Ineffective treatments:  None tried Associated symptoms: no abdominal pain, no abdominal swelling, no bladder incontinence, no bowel incontinence, no chest pain, no dysuria, no fever, no headaches, no leg pain, no numbness, no paresthesias, no pelvic pain, no perianal numbness, no tingling and no weakness     History reviewed. No pertinent past medical history. History reviewed. No pertinent past surgical history. No family history on file. History  Substance Use Topics  . Smoking status: Current Every Day Smoker -- 0.50 packs/day for 25 years    Types: Cigarettes    Last Attempt to Quit: 05/06/2013  . Smokeless tobacco: Never Used  . Alcohol Use: Yes     Comment: occasionally    Review of Systems  Constitutional: Negative for fever.  Respiratory: Negative for shortness of breath.   Cardiovascular: Negative for chest pain.  Gastrointestinal: Negative for vomiting, abdominal pain, constipation and bowel incontinence.   Genitourinary: Negative for bladder incontinence, dysuria, hematuria, flank pain, decreased urine volume, difficulty urinating and pelvic pain.       No perineal numbness or incontinence of urine or feces  Musculoskeletal: Positive for back pain. Negative for joint swelling.  Skin: Negative for rash.  Neurological: Negative for tingling, weakness, numbness, headaches and paresthesias.  All other systems reviewed and are negative.     Allergies  Review of patient's allergies indicates no known allergies.  Home Medications   Prior to Admission medications   Medication Sig Start Date End Date Taking? Authorizing Provider  cyclobenzaprine (FLEXERIL) 10 MG tablet Take 1 tablet (10 mg total) by mouth 3 (three) times daily as needed. 08/29/13   Dedee Liss L. Calle Schader, PA-C  HYDROcodone-acetaminophen (NORCO) 7.5-325 MG per tablet Take 1 tablet by mouth every 6 (six) hours as needed. For pain 04/17/13   Historical Provider, MD  HYDROcodone-acetaminophen (NORCO/VICODIN) 5-325 MG per tablet Take one-two tabs po q 4-6 hrs prn pain 08/29/13   Quandarius Nill L. Vina Byrd, PA-C  LORazepam (ATIVAN) 0.5 MG tablet Take 2 tablets (1 mg total) by mouth every 8 (eight) hours as needed (chest tightness). 05/08/13   Benny LennertJoseph L Zammit, MD  naproxen (NAPROSYN) 500 MG tablet Take 1 tablet (500 mg total) by mouth 2 (two) times daily. 08/29/13   Marlaina Coburn L. Leani Myron, PA-C   BP 148/86  Pulse 63  Temp(Src) 97.9 F (36.6 C) (Oral)  Resp 16  Ht 6\' 3"  (1.905 m)  Wt 190 lb (86.183 kg)  BMI 23.75 kg/m2  SpO2 100% Physical Exam  Nursing note and vitals  reviewed. Constitutional: He is oriented to person, place, and time. He appears well-developed and well-nourished. No distress.  HENT:  Head: Normocephalic and atraumatic.  Neck: Normal range of motion. Neck supple.  Cardiovascular: Normal rate, regular rhythm, normal heart sounds and intact distal pulses.   No murmur heard. Pulmonary/Chest: Effort normal and breath sounds normal. No  respiratory distress.  Abdominal: Soft. He exhibits no distension. There is no tenderness.  Musculoskeletal: He exhibits tenderness. He exhibits no edema.       Lumbar back: He exhibits tenderness and pain. He exhibits normal range of motion, no swelling, no deformity, no laceration and normal pulse.  Diffuse ttp of the lumbar paraspinal muscles.  No spinal tenderness.  DP pulses are brisk and symmetrical.  Distal sensation intact.  Hip Flexors/Extensors are intact.  Pt has 5/5 strength against resistance of bilateral lower extremities.     Neurological: He is alert and oriented to person, place, and time. He has normal strength. No sensory deficit. He exhibits normal muscle tone. Coordination and gait normal.  Reflex Scores:      Patellar reflexes are 2+ on the right side and 2+ on the left side.      Achilles reflexes are 2+ on the right side and 2+ on the left side. Skin: Skin is warm and dry. No rash noted.    ED Course  Procedures (including critical care time) Labs Review Labs Reviewed - No data to display  Imaging Review No results found.   EKG Interpretation None      MDM   Final diagnoses:  Lumbar strain, initial encounter    Pt is ambulatory w/o focal neuro deficits or other concerning sx's for emergent neurological or infectious process.  Likely strain.  Agrees to symptomatic treatment and close f/u with his PMD for recheck.  He appears stable for d/c    Wei Poplaski L. Trisha Mangle, PA-C 08/31/13 2120

## 2013-09-01 NOTE — ED Provider Notes (Signed)
Medical screening examination/treatment/procedure(s) were performed by non-physician practitioner and as supervising physician I was immediately available for consultation/collaboration.   EKG Interpretation None        Cid Agena F Abbigale Mcelhaney, MD 09/01/13 1447 

## 2013-11-01 ENCOUNTER — Emergency Department (HOSPITAL_COMMUNITY)
Admission: EM | Admit: 2013-11-01 | Discharge: 2013-11-01 | Disposition: A | Discharge status: Home / Self Care | Payer: PRIVATE HEALTH INSURANCE | Attending: Emergency Medicine | Admitting: Emergency Medicine

## 2013-11-01 ENCOUNTER — Encounter (HOSPITAL_COMMUNITY): Payer: Self-pay | Admitting: Emergency Medicine

## 2013-11-01 DIAGNOSIS — Y9389 Activity, other specified: Secondary | ICD-10-CM | POA: Insufficient documentation

## 2013-11-01 DIAGNOSIS — Y9289 Other specified places as the place of occurrence of the external cause: Secondary | ICD-10-CM | POA: Insufficient documentation

## 2013-11-01 DIAGNOSIS — Z791 Long term (current) use of non-steroidal anti-inflammatories (NSAID): Secondary | ICD-10-CM | POA: Insufficient documentation

## 2013-11-01 DIAGNOSIS — S39012A Strain of muscle, fascia and tendon of lower back, initial encounter: Secondary | ICD-10-CM | POA: Insufficient documentation

## 2013-11-01 DIAGNOSIS — Z79899 Other long term (current) drug therapy: Secondary | ICD-10-CM | POA: Insufficient documentation

## 2013-11-01 DIAGNOSIS — X58XXXA Exposure to other specified factors, initial encounter: Secondary | ICD-10-CM | POA: Insufficient documentation

## 2013-11-01 DIAGNOSIS — Z720 Tobacco use: Secondary | ICD-10-CM | POA: Insufficient documentation

## 2013-11-01 DIAGNOSIS — Y990 Civilian activity done for income or pay: Secondary | ICD-10-CM | POA: Insufficient documentation

## 2013-11-01 MED ORDER — NAPROXEN 500 MG PO TABS: 500.0000 mg | ORAL_TABLET | Freq: Two times a day (BID) | ORAL | Status: DC

## 2013-11-01 MED ORDER — HYDROCODONE-ACETAMINOPHEN 5-325 MG PO TABS: ORAL_TABLET | ORAL | Status: DC

## 2013-11-01 MED ORDER — CYCLOBENZAPRINE HCL 10 MG PO TABS: 10.0000 mg | ORAL_TABLET | Freq: Three times a day (TID) | ORAL | Status: DC | PRN

## 2013-11-01 NOTE — ED Provider Notes (Signed)
CSN: [id number]     Arrival date & time 11/01/13  0800 History   First MD Initiated Contact with Patient 11/01/13 [phone]     Chief Complaint  Patient presents with  . Back Pain     (Consider location/radiation/quality/duration/timing/severity/associated sxs/prior Treatment) HPI  Hayden Anderson is a 48 y.o. male who presents to the Emergency Department complaining of left-sided low back pain that began last evening while working. 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 Pain is worse with lying down and improves with movement.  He has not tried any medications.  He denies abdominal pain, fever, dysuria, bloody urine, numbness or weakness of the lower extremities, or pain radiating into his legs.  Patient reports hx of low back pain.   History reviewed. No pertinent past medical history. History reviewed. No pertinent past surgical history. No family history on file. History  Substance Use Topics  . Smoking status: Current Every Day Smoker -- 0.50 packs/day for 25 years    Types: Cigarettes    Last Attempt to Quit: 05/06/2013  . Smokeless tobacco: Never Used  . Alcohol Use: Yes     Comment: occasionally    Review of Systems  Constitutional: Negative for fever.  Respiratory: Negative for shortness of breath.   Gastrointestinal: Negative for vomiting, abdominal pain and constipation.  Genitourinary: Negative for dysuria, hematuria, flank pain, decreased urine volume and difficulty urinating.       Low back pain  Musculoskeletal: Positive for back pain. Negative for joint swelling.  Skin: Negative for rash.  Neurological: Negative for weakness and numbness.  All other systems reviewed and are negative.     Allergies  Review of patient's allergies indicates no known allergies.  Home Medications   Prior to Admission medications   Medication Sig Start Date End Date Taking? Authorizing Provider    cyclobenzaprine (FLEXERIL) 10 MG tablet Take 1 tablet (10 mg total) by mouth 3 (three) times daily as needed. 08/29/13   Jaemarie Hochberg L. Alaria Oconnor, PA-C  cyclobenzaprine (FLEXERIL) 10 MG tablet Take 1 tablet (10 mg total) by mouth 3 (three) times daily as needed. 11/01/13   Anikah Hogge L. Eila Runyan, PA-C  HYDROcodone-acetaminophen (NORCO) 7.5-325 MG per tablet Take 1 tablet by mouth every 6 (six) hours as needed. For pain 04/17/13   Historical Provider, MD  HYDROcodone-acetaminophen (NORCO/VICODIN) 5-325 MG per tablet Take one-two tabs po q 4-6 hrs prn pain 08/29/13   Jariya Reichow L. Carlita Whitcomb, PA-C  HYDROcodone-acetaminophen (NORCO/VICODIN) 5-325 MG per tablet Take one-two tabs po q 4-6 hrs prn pain 11/01/13   Makalynn Berwanger L. Byanca Kasper, PA-C  LORazepam (ATIVAN) 0.5 MG tablet Take 2 tablets (1 mg total) by mouth every 8 (eight) hours as needed (chest tightness). 05/08/13   Benny LennertJoseph L Zammit, MD  naproxen (NAPROSYN) 500 MG tablet Take 1 tablet (500 mg total) by mouth 2 (two) times daily. 08/29/13   Roslind Michaux L. Nakesha Ebrahim, PA-C  naproxen (NAPROSYN) 500 MG tablet Take 1 tablet (500 mg total) by mouth 2 (two) times daily. Take with food 11/01/13   Nashid Pellum L. Saudia Smyser, PA-C   BP 129/88  Pulse 68  Temp(Src) 97.8 F (36.6 C) (Oral)  Resp 16  Ht 6\' 3"  (1.905 m)  Wt 200 lb (90.719 kg)  BMI 25.00 kg/m2  SpO2 100% Physical Exam  Nursing note and vitals reviewed. Constitutional: He is oriented to person, place, and time. He appears well-developed and well-nourished. No distress.  HENT:  Head: Normocephalic and atraumatic.  Neck: Normal range of motion. Neck supple.  Cardiovascular: Normal rate, regular rhythm, normal heart sounds and intact distal pulses.   No murmur heard. Pulmonary/Chest: Effort normal and breath sounds normal. No respiratory distress.  Abdominal: Soft. He exhibits no distension. There is no tenderness.  Musculoskeletal: He exhibits tenderness. He exhibits no edema.       Lumbar back: He exhibits tenderness and pain. He  exhibits normal range of motion, no bony tenderness, no swelling, no deformity, no laceration and normal pulse.       Back:  ttp of the left lumbar paraspinal muscles.  No spinal tenderness.  DP pulses are brisk and symmetrical.  Distal sensation intact.  Hip Flexors/Extensors are intact.  Pt has 5/5 strength against resistance of bilateral lower extremities.     Neurological: He is alert and oriented to person, place, and time. He has normal strength. No sensory deficit. He exhibits normal muscle tone. Coordination and gait normal.  Reflex Scores:      Patellar reflexes are 2+ on the right side and 2+ on the left side.      Achilles reflexes are 2+ on the right side and 2+ on the left side. Skin: Skin is warm and dry. No rash noted.    ED Course  Procedures (including critical care time) Labs Review Labs Reviewed - No data to display  Imaging Review No results found.   EKG Interpretation None      MDM   Final diagnoses:  Lumbar strain, initial encounter    Patient is well appearing. Vital signs are stable. He ambulates with a steady gait. No focal neuro deficits. No concerning symptoms for emergent neurological or infectious process at this time. Patient advised to followup with primary care if the symptoms are not improving.    Stan Cantave L. Trisha Mangleriplett, PA-C 11/02/13 [phone]

## 2013-11-01 NOTE — ED Notes (Signed)
Lower back pain starting last night while at work.

## 2013-11-01 NOTE — Discharge Instructions (Signed)

## 2013-11-02 NOTE — ED Provider Notes (Signed)
Medical screening examination/treatment/procedure(s) were performed by non-physician practitioner and as supervising physician I was immediately available for consultation/collaboration.   EKG Interpretation None        Domanique Luckett L Magaline Steinberg, MD 11/02/13 1531 

## 2014-01-01 ENCOUNTER — Emergency Department (HOSPITAL_COMMUNITY)
Admission: EM | Admit: 2014-01-01 | Discharge: 2014-01-01 | Disposition: A | Discharge status: Home / Self Care | Payer: PRIVATE HEALTH INSURANCE | Attending: Emergency Medicine | Admitting: Emergency Medicine

## 2014-01-01 ENCOUNTER — Encounter (HOSPITAL_COMMUNITY): Payer: Self-pay | Admitting: Emergency Medicine

## 2014-01-01 DIAGNOSIS — M545 Low back pain, unspecified: Secondary | ICD-10-CM

## 2014-01-01 DIAGNOSIS — Z79899 Other long term (current) drug therapy: Secondary | ICD-10-CM | POA: Insufficient documentation

## 2014-01-01 DIAGNOSIS — Z720 Tobacco use: Secondary | ICD-10-CM | POA: Insufficient documentation

## 2014-01-01 DIAGNOSIS — R062 Wheezing: Secondary | ICD-10-CM | POA: Insufficient documentation

## 2014-01-01 MED ORDER — CYCLOBENZAPRINE HCL 10 MG PO TABS: 10.0000 mg | ORAL_TABLET | Freq: Two times a day (BID) | ORAL | Status: DC | PRN

## 2014-01-01 MED ORDER — HYDROCODONE-ACETAMINOPHEN 5-325 MG PO TABS: 1.0000 | ORAL_TABLET | ORAL | Status: DC | PRN

## 2014-01-01 MED ORDER — IBUPROFEN 800 MG PO TABS: 800.0000 mg | ORAL_TABLET | Freq: Three times a day (TID) | ORAL | Status: DC

## 2014-01-01 NOTE — ED Notes (Signed)
Pt reports lower back pain since moving furniture this afternoon. nad noted.

## 2014-01-01 NOTE — ED Provider Notes (Signed)
CSN: [id number]     Arrival date & time 01/01/14  1650 History  This chart was scribed for non-physician practitioner, Kerrie BuffaloHope Neese, NP, working with Enid SkeensJoshua M Zavitz, MD, by Ronney LionSuzanne Le, ED Scribe. This patient was seen in room APFT23/APFT23 and the patient's care was started at 5:36 PM.    Chief Complaint  Patient presents with  . Back Pain   Patient is a 48 y.o. male presenting with back pain. The history is provided by the patient. No language interpreter was used.  Back Pain Location:  Lumbar spine Radiates to:  Does not radiate Pain severity:  Mild Onset quality:  Sudden Duration:  9 hours Timing:  Constant Context: lifting heavy objects   Relieved by:  Lying down Worsened by:  Nothing tried Ineffective treatments:  None tried Associated symptoms: no bladder incontinence, no bowel incontinence, no dysuria and no numbness      HPI Comments: Hayden Anderson is a 48 y.o. male who presents to the Emergency Department complaining of sudden onset, lower back pain that began about 9 hours ago when patient was trying to move a bed and felt a pulling sensation. Patient has tried lying down with some relief. He hasn't tried any medications for his symptoms. Patient has a history of minor back strains. He denies a history of back surgery. Patient denies numbness, incontinence, urinary frequency, dysuria, or any other urinary symptoms. He denies sharp, shooting pains. Patient is a smoker.    History reviewed. No pertinent past medical history. History reviewed. No pertinent past surgical history. History reviewed. No pertinent family history. History  Substance Use Topics  . Smoking status: Current Every Day Smoker -- 0.50 packs/day for 25 years    Types: Cigarettes    Last Attempt to Quit: 05/06/2013  . Smokeless tobacco: Never Used  . Alcohol Use: Yes     Comment: occasionally    Review of Systems  Gastrointestinal: Negative for bowel incontinence.  Genitourinary: Negative for  bladder incontinence, dysuria, urgency, frequency, hematuria, decreased urine volume and difficulty urinating.  Musculoskeletal: Positive for back pain.  Neurological: Negative for numbness.  All other systems reviewed and are negative.     Allergies  Review of patient's allergies indicates no known allergies.  Home Medications   Prior to Admission medications   Medication Sig Start Date End Date Taking? Authorizing Provider  cyclobenzaprine (FLEXERIL) 10 MG tablet Take 1 tablet (10 mg total) by mouth 2 (two) times daily as needed for muscle spasms. 01/01/14   Hope Orlene OchM Neese, NP  HYDROcodone-acetaminophen (NORCO/VICODIN) 5-325 MG per tablet Take 1 tablet by mouth every 4 (four) hours as needed. 01/01/14   Hope Orlene OchM Neese, NP  ibuprofen (ADVIL,MOTRIN) 800 MG tablet Take 1 tablet (800 mg total) by mouth 3 (three) times daily. 01/01/14   Hope Orlene OchM Neese, NP  LORazepam (ATIVAN) 0.5 MG tablet Take 2 tablets (1 mg total) by mouth every 8 (eight) hours as needed (chest tightness). 05/08/13   Benny LennertJoseph L Zammit, MD   BP 136/77 mmHg  Pulse 71  Temp(Src) 98.2 F (36.8 C) (Oral)  Resp 20  Ht 6\' 2"  (1.88 m)  Wt 190 lb (86.183 kg)  BMI 24.38 kg/m2  SpO2 100% Physical Exam  Constitutional: He is oriented to person, place, and time. He appears well-developed and well-nourished. No distress.  HENT:  Head: Normocephalic and atraumatic.  Eyes: EOM are normal. Pupils are equal, round, and reactive to light.  Neck: Normal range of motion. Neck supple.  Cardiovascular: Normal  rate and regular rhythm.   Pulmonary/Chest: Effort normal. No respiratory distress. He has wheezes (mild wheezing). He has no rales.  Abdominal: Soft. Bowel sounds are normal. There is no tenderness.  Musculoskeletal: Normal range of motion. He exhibits no edema.       Lumbar back: He exhibits tenderness. He exhibits normal range of motion, no deformity, no spasm and normal pulse.  Neurological: He is alert and oriented to person, place,  and time. He has normal strength. No cranial nerve deficit or sensory deficit. Coordination and gait normal.  Reflex Scores:      Bicep reflexes are 2+ on the right side and 2+ on the left side.      Brachioradialis reflexes are 2+ on the right side and 2+ on the left side.      Patellar reflexes are 2+ on the right side and 2+ on the left side.      Achilles reflexes are 2+ on the right side and 2+ on the left side. Negative straight leg raises. Ambulatory with steady gait, no foot drag.  Skin: Skin is warm and dry.  Psychiatric: He has a normal mood and affect. His behavior is normal.  Nursing note and vitals reviewed.   ED Course  Procedures (including critical care time)  DIAGNOSTIC STUDIES: Oxygen Saturation is 100% on room air, normal by my interpretation.    COORDINATION OF CARE: 5:41 PM - Discussed treatment plan with pt at bedside and pt agreed to plan.  MDM  48 y.o. male with low back pain after moving furniture earlier today. Stable for discharge without neuro deficits. Will treat for pain and muscle spasm. Discussed with the patient and all questioned fully answered. He will return if any problems arise.  Final diagnoses:  Lumbosacral pain   I personally performed the services described in this documentation, which was scribed in my presence. The recorded information has been reviewed and is accurate.    [address]Hope New FranklinM Neese, [place]NP 01/03/14 [id number]  Enid SkeensJoshua M Zavitz, MD 01/08/14 [phone]

## 2015-05-07 ENCOUNTER — Ambulatory Visit: Payer: PRIVATE HEALTH INSURANCE | Admitting: Orthopaedic Surgery

## 2015-06-03 ENCOUNTER — Encounter (HOSPITAL_COMMUNITY): Payer: Self-pay | Admitting: Emergency Medicine

## 2015-06-03 DIAGNOSIS — Y93F2 Activity, caregiving, lifting: Secondary | ICD-10-CM | POA: Diagnosis not present

## 2015-06-03 DIAGNOSIS — S39012A Strain of muscle, fascia and tendon of lower back, initial encounter: Secondary | ICD-10-CM | POA: Diagnosis not present

## 2015-06-03 DIAGNOSIS — X500XXA Overexertion from strenuous movement or load, initial encounter: Secondary | ICD-10-CM | POA: Insufficient documentation

## 2015-06-03 DIAGNOSIS — Y990 Civilian activity done for income or pay: Secondary | ICD-10-CM | POA: Diagnosis not present

## 2015-06-03 DIAGNOSIS — F17210 Nicotine dependence, cigarettes, uncomplicated: Secondary | ICD-10-CM | POA: Insufficient documentation

## 2015-06-03 DIAGNOSIS — S3992XA Unspecified injury of lower back, initial encounter: Secondary | ICD-10-CM | POA: Diagnosis present

## 2015-06-03 DIAGNOSIS — Y9289 Other specified places as the place of occurrence of the external cause: Secondary | ICD-10-CM | POA: Insufficient documentation

## 2015-06-03 NOTE — ED Notes (Signed)
Pt c/o lower back pain since working today.

## 2015-06-04 ENCOUNTER — Emergency Department (HOSPITAL_COMMUNITY)
Admission: EM | Admit: 2015-06-04 | Discharge: 2015-06-04 | Disposition: A | Discharge status: Home / Self Care | Payer: BLUE CROSS/BLUE SHIELD | Attending: Emergency Medicine | Admitting: Emergency Medicine

## 2015-06-04 DIAGNOSIS — S39012A Strain of muscle, fascia and tendon of lower back, initial encounter: Secondary | ICD-10-CM

## 2015-06-04 MED ORDER — IBUPROFEN 800 MG PO TABS
800.0000 mg | ORAL_TABLET | Freq: Three times a day (TID) | ORAL | Status: DC
Start: 2015-06-04 — End: 2015-12-01

## 2015-06-04 MED ORDER — IBUPROFEN 800 MG PO TABS
800.0000 mg | ORAL_TABLET | Freq: Once | ORAL | Status: AC
  Administered 2015-06-04: 800 mg via ORAL
  Filled 2015-06-04: qty 1

## 2015-06-04 MED ORDER — HYDROCODONE-ACETAMINOPHEN 5-325 MG PO TABS
1.0000 | ORAL_TABLET | Freq: Once | ORAL | Status: AC
  Administered 2015-06-04: 1 via ORAL
  Filled 2015-06-04: qty 1

## 2015-06-04 MED ORDER — TRAMADOL HCL 50 MG PO TABS: 50.0000 mg | ORAL_TABLET | Freq: Four times a day (QID) | ORAL | Status: DC | PRN

## 2015-06-04 MED ORDER — DIAZEPAM 5 MG PO TABS
10.0000 mg | ORAL_TABLET | Freq: Once | ORAL | Status: AC
  Administered 2015-06-04: 10 mg via ORAL
  Filled 2015-06-04: qty 2

## 2015-06-04 MED ORDER — METHOCARBAMOL 500 MG PO TABS: 500.0000 mg | ORAL_TABLET | Freq: Three times a day (TID) | ORAL | Status: DC

## 2015-06-04 NOTE — ED Provider Notes (Signed)
CSN: [id number]     Arrival date & time 06/03/15  2341 History   First MD Initiated Contact with Patient 06/04/15 0053     Chief Complaint  Patient presents with  . Back Pain     (Consider location/radiation/quality/duration/timing/severity/associated sxs/prior Treatment) HPI Comments: Patient is a 50 year old male who presents to the emergency department with a complaint of lower back pain.  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 He states that he has been having some pain over the last few days, but today the pain was particularly bad. He states at home he tried conservative measures, but these have not been effective. The pain interfered with his rest tonight and he came to the emergency department for additional evaluation and for assistance with his pain. The patient is not had any previous operations or procedures involving his back. He has had problems with his back though over the last 2 or 3 years. He states he has not been evaluated formally by an orthopedist.  The history is provided by the patient.    History reviewed. No pertinent past medical history. History reviewed. No pertinent past surgical history. History reviewed. No pertinent family history. Social History  Substance Use Topics  . Smoking status: Current Every Day Smoker -- 0.50 packs/day for 25 years    Types: Cigarettes    Last Attempt to Quit: 05/06/2013  . Smokeless tobacco: Never Used  . Alcohol Use: Yes     Comment: occasionally    Review of Systems  Constitutional: Negative for fever, chills, activity change and appetite change.  Musculoskeletal: Positive for back pain.  All other systems reviewed and are negative.     Allergies  Review of patient's allergies indicates no known allergies.  Home Medications   Prior to Admission medications   Medication Sig Start Date End Date Taking? Authorizing Provider  cyclobenzaprine (FLEXERIL) 10 MG  tablet Take 1 tablet (10 mg total) by mouth 2 (two) times daily as needed for muscle spasms. 01/01/14   Hope Orlene Och, NP  HYDROcodone-acetaminophen (NORCO/VICODIN) 5-325 MG per tablet Take 1 tablet by mouth every 4 (four) hours as needed. 01/01/14   Hope Orlene Och, NP  ibuprofen (ADVIL,MOTRIN) 800 MG tablet Take 1 tablet (800 mg total) by mouth 3 (three) times daily. 01/01/14   Hope Orlene Och, NP  LORazepam (ATIVAN) 0.5 MG tablet Take 2 tablets (1 mg total) by mouth every 8 (eight) hours as needed (chest tightness). 05/08/13   Bethann Berkshire, MD   BP 159/98 mmHg  Pulse 64  Temp(Src) 97.9 F (36.6 C) (Oral)  Resp 20  Ht  (1.854 m)  Wt 86.183 kg  BMI 25.07 kg/m2  SpO2 100% Physical Exam  Constitutional: He is oriented to person, place, and time. He appears well-developed and well-nourished.  Non-toxic appearance.  HENT:  Head: Normocephalic.  Right Ear: Tympanic membrane and external ear normal.  Left Ear: Tympanic membrane and external ear normal.  Eyes: EOM and lids are normal. Pupils are equal, round, and reactive to light.  Neck: Normal range of motion. Neck supple. Carotid bruit is not present.  Cardiovascular: Normal rate, regular rhythm, normal heart sounds, intact distal pulses and normal pulses.   Pulmonary/Chest: Breath sounds normal. No respiratory distress.  Abdominal: Soft. Bowel sounds are normal. There is no tenderness. There is no guarding.  Musculoskeletal: Normal range of motion.  There is paraspinal area tenderness in the lower thoracic and  also the lumbar spine area. There no hot areas appreciated. There is no palpable step off of the thoracic or the lumbar spine.  Lymphadenopathy:       Head (right side): No submandibular adenopathy present.       Head (left side): No submandibular adenopathy present.    He has no cervical adenopathy.  Neurological: He is alert and oriented to person, place, and time. He has normal strength. No cranial nerve deficit or sensory  deficit.  There no gross motor or sensory deficits appreciated. Gait is intact. There is no foot drop appreciated. No evidence of difficulty with balance.  Skin: Skin is warm and dry.  Psychiatric: He has a normal mood and affect. His speech is normal.  Nursing note and vitals reviewed.   ED Course  Procedures (including critical care time) Labs Review Labs Reviewed - No data to display  Imaging Review No results found. I have personally reviewed and evaluated these images and lab results as part of my medical decision-making.   EKG Interpretation None      MDM  Vital signs are within normal limits. There is no caudal equina appreciated, or no emergent findings at this time. The examination favors muscle strain involving the lumbar area. The patient will be treated with ibuprofen and Robaxin as well as all from. The patient is to see Dr. Romeo AppleHarrison for additional evaluation if not improving. Questions were answered. The patient is in agreement with this discharge plan.    Final diagnoses:  Lumbar strain, initial encounter    *I have reviewed nursing notes, vital signs, and all appropriate lab and imaging results for this patient.[address]    Darilyn Storbeck, PA-C 06/04/15 0121  Marily MemosJason Mesner, MD 06/04/15 [phone]

## 2015-06-04 NOTE — Discharge Instructions (Signed)
Heating pad to the back will be helpful. Rest your back as much as possible. Use medications as suggested. Robaxin and ultram may cause drowsiness, use these medications with caution.  Please see Dr Romeo AppleHarrison for orthopedic evaluation if not improving.

## 2015-06-21 ENCOUNTER — Emergency Department (HOSPITAL_COMMUNITY)
Admission: EM | Admit: 2015-06-21 | Discharge: 2015-06-21 | Disposition: A | Discharge status: Home / Self Care | Payer: BLUE CROSS/BLUE SHIELD | Attending: Emergency Medicine | Admitting: Emergency Medicine

## 2015-06-21 ENCOUNTER — Encounter (HOSPITAL_COMMUNITY): Payer: Self-pay | Admitting: Cardiology

## 2015-06-21 ENCOUNTER — Emergency Department (HOSPITAL_COMMUNITY): Payer: BLUE CROSS/BLUE SHIELD

## 2015-06-21 DIAGNOSIS — Y999 Unspecified external cause status: Secondary | ICD-10-CM | POA: Insufficient documentation

## 2015-06-21 DIAGNOSIS — S46811A Strain of other muscles, fascia and tendons at shoulder and upper arm level, right arm, initial encounter: Secondary | ICD-10-CM | POA: Insufficient documentation

## 2015-06-21 DIAGNOSIS — W11XXXA Fall on and from ladder, initial encounter: Secondary | ICD-10-CM | POA: Insufficient documentation

## 2015-06-21 DIAGNOSIS — S6391XA Sprain of unspecified part of right wrist and hand, initial encounter: Secondary | ICD-10-CM | POA: Insufficient documentation

## 2015-06-21 DIAGNOSIS — Y929 Unspecified place or not applicable: Secondary | ICD-10-CM | POA: Diagnosis not present

## 2015-06-21 DIAGNOSIS — Y939 Activity, unspecified: Secondary | ICD-10-CM | POA: Insufficient documentation

## 2015-06-21 DIAGNOSIS — F17210 Nicotine dependence, cigarettes, uncomplicated: Secondary | ICD-10-CM | POA: Diagnosis not present

## 2015-06-21 DIAGNOSIS — M25531 Pain in right wrist: Secondary | ICD-10-CM | POA: Diagnosis present

## 2015-06-21 DIAGNOSIS — S63501A Unspecified sprain of right wrist, initial encounter: Secondary | ICD-10-CM

## 2015-06-21 DIAGNOSIS — Z79899 Other long term (current) drug therapy: Secondary | ICD-10-CM | POA: Diagnosis not present

## 2015-06-21 MED ORDER — CYCLOBENZAPRINE HCL 10 MG PO TABS: 10.0000 mg | ORAL_TABLET | Freq: Three times a day (TID) | ORAL | Status: DC

## 2015-06-21 MED ORDER — CYCLOBENZAPRINE HCL 10 MG PO TABS
10.0000 mg | ORAL_TABLET | Freq: Once | ORAL | Status: AC
  Administered 2015-06-21: 10 mg via ORAL
  Filled 2015-06-21: qty 1

## 2015-06-21 MED ORDER — KETOROLAC TROMETHAMINE 10 MG PO TABS
10.0000 mg | ORAL_TABLET | Freq: Once | ORAL | Status: AC
  Administered 2015-06-21: 10 mg via ORAL
  Filled 2015-06-21: qty 1

## 2015-06-21 MED ORDER — DICLOFENAC SODIUM 75 MG PO TBEC: 75.0000 mg | DELAYED_RELEASE_TABLET | Freq: Two times a day (BID) | ORAL | Status: DC

## 2015-06-21 NOTE — ED Notes (Signed)
Fell off ladder yesterday at 6 pm.  C/o headache, neck pain, right wrist pain.

## 2015-06-21 NOTE — Discharge Instructions (Signed)
Your x-rays are negative for fracture or dislocation. Please use the wrist splint over the next 7 or 8 days. Please see Dr. Romeo AppleHarrison for orthopedic evaluation if not improving. Use flexeril for muscle strain. This medication may cause drowsiness. Please do not drink, drive, or participate in activity that requires concentration while taking this medication.Use diclofenac 2 times daily with food. Muscle Strain A muscle strain is an injury that occurs when a muscle is stretched beyond its normal length. Usually a small number of muscle fibers are torn when this happens. Muscle strain is rated in degrees. First-degree strains have the least amount of muscle fiber tearing and pain. Second-degree and third-degree strains have increasingly more tearing and pain.  Usually, recovery from muscle strain takes 1-2 weeks. Complete healing takes 5-6 weeks.  CAUSES  Muscle strain happens when a sudden, violent force placed on a muscle stretches it too far. This may occur with lifting, sports, or a fall.  RISK FACTORS Muscle strain is especially common in athletes.  SIGNS AND SYMPTOMS At the site of the muscle strain, there may be:  Pain.  Bruising.  Swelling.  Difficulty using the muscle due to pain or lack of normal function. DIAGNOSIS  Your health care provider will perform a physical exam and ask about your medical history. TREATMENT  Often, the best treatment for a muscle strain is resting, icing, and applying cold compresses to the injured area.  HOME CARE INSTRUCTIONS   Use the PRICE method of treatment to promote muscle healing during the first 2-3 days after your injury. The PRICE method involves:  Protecting the muscle from being injured again.  Restricting your activity and resting the injured body part.  Icing your injury. To do this, put ice in a plastic bag. Place a towel between your skin and the bag. Then, apply the ice and leave it on from 15-20 minutes each hour. After the third  day, switch to moist heat packs.  Apply compression to the injured area with a splint or elastic bandage. Be careful not to wrap it too tightly. This may interfere with blood circulation or increase swelling.  Elevate the injured body part above the level of your heart as often as you can.  Only take over-the-counter or prescription medicines for pain, discomfort, or fever as directed by your health care provider.  Warming up prior to exercise helps to prevent future muscle strains. SEEK MEDICAL CARE IF:   You have increasing pain or swelling in the injured area.  You have numbness, tingling, or a significant loss of strength in the injured area. MAKE SURE YOU:   Understand these instructions.  Will watch your condition.  Will get help right away if you are not doing well or get worse.   This information is not intended to replace advice given to you by your health care provider. Make sure you discuss any questions you have with your health care provider.   Document Released: 12/29/2004 Document Revised: 10/19/2012 Document Reviewed: 07/28/2012 Elsevier Interactive Patient Education [employer]2016 [employer]

## 2015-06-21 NOTE — ED Provider Notes (Signed)
CSN: [id number]     Arrival date & time 06/21/15  1815 History   First MD Initiated Contact with Patient 06/21/15 1900     Chief Complaint  Patient presents with  . Fall     (Consider location/radiation/quality/duration/timing/severity/associated sxs/prior Treatment) HPI Comments: Patient is a 50 year old male who presents to the emergency department with a complaint of neck pain, headache, and wrist pain following a fall.  The patient states that a proximally 6 PM on yesterday June 8 he fell coming down a ladder on. He is unsure of how high he was on the ground. He attempted to break his fall with his right hand. He states he has minimal pain on yesterday, but today the pain was much worse. He had a headache, and right-sided neck pain extending into the shoulder. He is concerned that he may have some neck injury related to his headache. He denies hitting his head. There was no loss of consciousness. 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 There's been no nausea, or vomiting. There's been no difficulty holding objects, and his been no dropping of objects. There's been no difficulty with coordination of the lower extremities or the upper extremities. The patient has pain and soreness of the right wrist, but has not noted any significant swelling. He denies being on any anticoagulation medications, and his been no history of any bleeding issues.  Patient is a 50 y.o. male presenting with fall. The history is provided by the patient.  Fall This is a new problem. The current episode started yesterday. Associated symptoms include arthralgias.    History reviewed. No pertinent past medical history. History reviewed. No pertinent past surgical history. History reviewed. No pertinent family history. Social History  Substance Use Topics  . Smoking status: Current Every Day Smoker -- 0.50 packs/day for 25 years    Types: Cigarettes    Last Attempt to Quit: 05/06/2013  .  Smokeless tobacco: Never Used  . Alcohol Use: Yes     Comment: occasionally    Review of Systems  Musculoskeletal: Positive for back pain and arthralgias.  All other systems reviewed and are negative.     Allergies  Review of patient's allergies indicates no known allergies.  Home Medications   Prior to Admission medications   Medication Sig Start Date End Date Taking? Authorizing Provider  cyclobenzaprine (FLEXERIL) 10 MG tablet Take 1 tablet (10 mg total) by mouth 2 (two) times daily as needed for muscle spasms. 01/01/14   Hope Orlene Och, NP  HYDROcodone-acetaminophen (NORCO/VICODIN) 5-325 MG per tablet Take 1 tablet by mouth every 4 (four) hours as needed. 01/01/14   Hope Orlene Och, NP  ibuprofen (ADVIL,MOTRIN) 800 MG tablet Take 1 tablet (800 mg total) by mouth 3 (three) times daily. 06/04/15   Ivery Quale, PA-C  LORazepam (ATIVAN) 0.5 MG tablet Take 2 tablets (1 mg total) by mouth every 8 (eight) hours as needed (chest tightness). 05/08/13   Bethann Berkshire, MD  methocarbamol (ROBAXIN) 500 MG tablet Take 1 tablet (500 mg total) by mouth 3 (three) times daily. 06/04/15   Ivery Quale, PA-C  traMADol (ULTRAM) 50 MG tablet Take 1 tablet (50 mg total) by mouth every 6 (six) hours as needed. 06/04/15   Ivery Quale, PA-C   BP 155/85 mmHg  Pulse 85  Temp(Src) 98.6 F (37 C) (Oral)  Resp 20  Ht  (1.88 m)  Wt 88.451 kg  BMI 25.03 kg/m2  SpO2 99% Physical Exam  Constitutional: He is oriented to person, place, and time. He appears well-developed and well-nourished.  Non-toxic appearance.  HENT:  Head: Normocephalic.  Right Ear: Tympanic membrane and external ear normal.  Left Ear: Tympanic membrane and external ear normal.  Negative battles sign. There is no drainage or discharge from the ears. There is no facial area tenderness or deformity.  Eyes: EOM and lids are normal. Pupils are equal, round, and reactive to light.  Neck: Normal range of motion. Neck supple. Carotid bruit  is not present.  Cardiovascular: Normal rate, regular rhythm, normal heart sounds, intact distal pulses and normal pulses.   Pulmonary/Chest: Breath sounds normal. No respiratory distress.  No chest wall or rib area tenderness appreciated. There is symmetrical rise and fall of the chest.  Abdominal: Soft. Bowel sounds are normal. There is no tenderness. There is no guarding.  Musculoskeletal: Normal range of motion.  There is no palpable step off of the cervical spine. There is tightness and tenseness of the right greater than left upper trapezius. There is good range of motion of the right wrist, but with pain. Is full range of motion of the fingers, elbow, and shoulder on the right.  Lymphadenopathy:       Head (right side): No submandibular adenopathy present.       Head (left side): No submandibular adenopathy present.    He has no cervical adenopathy.  Neurological: He is alert and oriented to person, place, and time. He has normal strength. No cranial nerve deficit or sensory deficit.  No gross neurologic deficit appreciated. Grip is symmetrical. Shoulder shrug is symmetrical. Gait is steady and intact without any problem whatsoever.  Skin: Skin is warm and dry.  Psychiatric: He has a normal mood and affect. His speech is normal.  Nursing note and vitals reviewed.   ED Course  Procedures (including critical care time) Labs Review Labs Reviewed - No data to display  Imaging Review Dg Cervical Spine Complete  06/21/2015  CLINICAL DATA:  Posterior neck pain following fall from ladder, initial encounter EXAM: CERVICAL SPINE - COMPLETE 4+ VIEW COMPARISON:  None. FINDINGS: Seven cervical segments are well visualized. Vertebral body height is well maintained. Alignment is also well maintained. No prevertebral soft tissue swelling is seen. No neural foraminal changes are noted. The odontoid is within normal limits. No acute fracture or acute facet abnormality is seen. IMPRESSION: No acute  abnormality noted. Electronically Signed   By: Alcide CleverMark  Lukens M.D.   On: 06/21/2015 19:45   Dg Wrist Complete Right  06/21/2015  CLINICAL DATA:  Fall from ladder yesterday with persistent right wrist pain, initial encounter EXAM: RIGHT WRIST - COMPLETE 3+ VIEW COMPARISON:  None. FINDINGS: There are changes consistent with a prior fracture at the base of the fifth metacarpal with healing. Mild degenerative changes are noted in the carpal rows. Additionally fusion of the lunate and triquetrum is seen. No acute fracture or dislocation is noted. IMPRESSION: Chronic changes without acute abnormality. Electronically Signed   By: Alcide CleverMark  Lukens M.D.   On: 06/21/2015 19:44   I have personally reviewed and evaluated these images and lab results as part of my medical decision-making.   EKG Interpretation None      MDM  X-ray of the cervical spine shows no acute abnormality. X-ray of the right wrist reveals some chronic changes, but no acute abnormality. Vital signs are well within normal limits. Pulse oximetry is 99-100% on room air.  Suspect the patient has some trapezius strain right  greater than left, as well as wrist sprain. The patient will be fitted with a wrist splint on. Prescription for diclofenac and Flexeril will be given to the patient to use for his discomfort. He is to see Dr. Romeo Apple for additional orthopedic evaluation if not improving.    Final diagnoses:  None    *I have reviewed nursing notes, vital signs, and all appropriate lab and imaging results for this patient.Ivery Quale, PA-C 06/21/15 2017  Donnetta Hutching, MD 06/22/15 [phone]

## 2015-12-01 ENCOUNTER — Emergency Department (HOSPITAL_COMMUNITY): Payer: BLUE CROSS/BLUE SHIELD

## 2015-12-01 ENCOUNTER — Encounter (HOSPITAL_COMMUNITY): Payer: Self-pay | Admitting: Emergency Medicine

## 2015-12-01 ENCOUNTER — Emergency Department (HOSPITAL_COMMUNITY)
Admission: EM | Admit: 2015-12-01 | Discharge: 2015-12-01 | Disposition: A | Discharge status: Home / Self Care | Payer: BLUE CROSS/BLUE SHIELD | Attending: Emergency Medicine | Admitting: Emergency Medicine

## 2015-12-01 DIAGNOSIS — M19022 Primary osteoarthritis, left elbow: Secondary | ICD-10-CM

## 2015-12-01 DIAGNOSIS — F17210 Nicotine dependence, cigarettes, uncomplicated: Secondary | ICD-10-CM | POA: Insufficient documentation

## 2015-12-01 DIAGNOSIS — M19042 Primary osteoarthritis, left hand: Secondary | ICD-10-CM | POA: Insufficient documentation

## 2015-12-01 DIAGNOSIS — M25522 Pain in left elbow: Secondary | ICD-10-CM | POA: Diagnosis present

## 2015-12-01 MED ORDER — NAPROXEN 500 MG PO TABS: 500.0000 mg | ORAL_TABLET | Freq: Two times a day (BID) | ORAL | 0 refills | Status: DC

## 2015-12-01 NOTE — ED Provider Notes (Signed)
[facility]-EMERGENCY DEPT Provider Note   CSN: [id number] Arrival date & time: 12/01/15  1123  By signing my name below, I, Soijett Blue, attest that this documentation has been prepared under the direction and in the presence of Burgess AmorJulie Jose Corvin, PA-C Electronically Signed: Soijett Blue, ED Scribe. 12/01/15. 1:32 PM.   History   Chief Complaint Chief Complaint  Patient presents with  . Joint Pain    HPI Hayden Anderson is a 50 y.o. male who presents to the Emergency Department complaining of intermittent joint pain onset 1 month ago. Pt notes that he has pain to his joints of his bilateral arms, hands, wrists, and fingers. Pt reports that his joint pain is worsened with heavy lifting and he denies alleviating factors for his joint pain. Pt states that his left elbow will "click" with movement. 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 Pt denies any recent injury, trauma, or fall. Pt notes that he is right hand dominant. Pt states that he was evaluated in the past by Dr. Hilda LiasKeeling for his symptoms and informed that he had arthritis, but there weren't any test or imaging completed. Pt is having associated symptoms of pain to joints of bilateral arms, pain to joints of bilateral hands, and neck pain. He notes that he has tried ASA with mild relief of his symptoms. He denies fever, numbness, tingling, shoulder pain, color change, wound, rash, joint swelling, and any other symptoms. Denies having a PCP at this time. Denies family hx of rheumatoid arthritis or osteoarthritis.    The history is provided by the patient. No language interpreter was used.    History reviewed. No pertinent past medical history.  There are no active problems to display for this patient.   History reviewed. No pertinent surgical history.     Home Medications    Prior to Admission medications   Medication Sig Start Date End Date Taking? Authorizing Provider  cyclobenzaprine (FLEXERIL) 10 MG tablet  Take 1 tablet (10 mg total) by mouth 3 (three) times daily. Patient not taking: Reported on 12/01/2015 06/21/15   Ivery QualeHobson Bryant, PA-C  diclofenac (VOLTAREN) 75 MG EC tablet Take 1 tablet (75 mg total) by mouth 2 (two) times daily. Patient not taking: Reported on 12/01/2015 06/21/15   Ivery QualeHobson Bryant, PA-C  naproxen (NAPROSYN) 500 MG tablet Take 1 tablet (500 mg total) by mouth 2 (two) times daily. 12/01/15   Burgess AmorJulie Ionia Schey, PA-C    Family History Family History  Problem Relation Age of Onset  . Hypertension Mother   . Hypertension Brother   . Hypertension Other     Social History Social History  Substance Use Topics  . Smoking status: Current Every Day Smoker    Packs/day: 0.50    Years: 25.00    Types: Cigarettes    Last attempt to quit: 05/06/2013  . Smokeless tobacco: Never Used  . Alcohol use Yes     Comment: occasionally     Allergies   Patient has no known allergies.   Review of Systems Review of Systems  Constitutional: Negative for fever.  Musculoskeletal: Positive for arthralgias (joint of bilateral arms and bilateral hands). Negative for joint swelling.  Skin: Negative for color change, rash and wound.  Neurological: Negative for numbness.       No tingling    Physical Exam Updated Vital Signs Pulse 67   Temp 98 F (36.7 C) (Oral)   Resp 14   Ht 6\' 2"  (1.88 m)   Wt  190 lb (86.2 kg)   SpO2 100%   BMI 24.39 kg/m   Physical Exam  Constitutional: He appears well-developed and well-nourished.  HENT:  Head: Atraumatic.  Neck: Normal range of motion.  Cardiovascular:  Pulses equal bilaterally  Musculoskeletal: He exhibits no edema or tenderness.  No TTP noted. Good radial pulses. With flexion of left forearm, faint crepitus at elbow with FROM. Sensation intact. Grip strength full and equal bilaterally. No appreciable joint edema or erythema. Skin is intact.   Neurological: He is alert. He has normal strength. He displays normal reflexes. No sensory deficit.    Skin: Skin is warm and dry. No erythema.  Psychiatric: He has a normal mood and affect.     ED Treatments / Results  DIAGNOSTIC STUDIES: Oxygen Saturation is 100% on RA, nl by my interpretation.    COORDINATION OF CARE: 1:29 PM Discussed treatment plan with pt at bedside which includes left elbow xray, left hand xray, and pt agreed to plan.   Radiology Dg Elbow Complete Left  Result Date: 12/01/2015 CLINICAL DATA:  50 year old male with a history of joint pain for 2-3 weeks EXAM: LEFT ELBOW - COMPLETE 3+ VIEW COMPARISON:  None. FINDINGS: No acute displaced fracture identified. No joint effusion. Degenerative changes at the posterior elbow. No radiopaque foreign body. No focal soft tissue swelling. IMPRESSION: No evidence of acute bony abnormality. Signed, Yvone NeuJaime S. Loreta AveWagner, DO [facility] [place]  [facility] Electronically Signed   By: Gilmer MorJaime  Wagner D.O.   On: 12/01/2015 14:06   Dg Hand Complete Left  Result Date: 12/01/2015 CLINICAL DATA:  Joint pain for 2-3 weeks.  No known injury. EXAM: LEFT HAND - COMPLETE 3+ VIEW COMPARISON:  None. FINDINGS: Degenerative changes in the third distal interphalangeal joint best seen on the lateral view. No other significant degenerative changes. No joint space narrowing or erosions. IMPRESSION: Degenerative changes of the distal third interphalangeal joint. No other cause for the patient's symptoms identified. Electronically Signed   By: Gerome Samavid  Williams III M.D   On: 12/01/2015 14:10    Procedures Procedures (including critical care time)  Medications Ordered in ED Medications - No data to display   Initial Impression / Assessment and Plan / ED Course  I have reviewed the triage vital signs and the nursing notes.  Pertinent imaging results that were available during my care of the patient were reviewed by me and considered in my medical decision making (see chart for details).  Clinical Course     Heat  tx, naproxen, also suggested arthritis strength tylenol, consider glucosamine/chondroitin supplement.  Referral given for pcp.  Final Clinical Impressions(s) / ED Diagnoses   Final diagnoses:  Primary osteoarthritis of left elbow  Primary osteoarthritis of left hand    New Prescriptions Discharge Medication List as of 12/01/2015  2:45 PM    START taking these medications   Details  naproxen (NAPROSYN) 500 MG tablet Take 1 tablet (500 mg total) by mouth 2 (two) times daily., Starting Sun 12/01/2015, Print       I personally performed the services described in this documentation, which was scribed in my presence. The recorded information has been reviewed and is accurate.     Burgess AmorJulie Sanad Fearnow, PA-C 12/01/15 1638    Donnetta HutchingBrian Cook, MD 12/02/15 1002

## 2015-12-01 NOTE — Discharge Instructions (Signed)
In addition to the naproxen,  you may benefit from arthritis strength tylenol (no prescription needed).  You may also obtain long term benefits from a supplement including glucosamine and chondroitin (also no prescription needed).  One example of this is called Move Free.  Heat can also help with pain relief.

## 2015-12-01 NOTE — ED Triage Notes (Signed)
Pt reports pain in the joints of his arms and hands, onset of symptoms 2 weeks ago.

## 2016-02-15 ENCOUNTER — Emergency Department (HOSPITAL_COMMUNITY): Payer: BLUE CROSS/BLUE SHIELD

## 2016-02-15 ENCOUNTER — Encounter (HOSPITAL_COMMUNITY): Payer: Self-pay | Admitting: Emergency Medicine

## 2016-02-15 ENCOUNTER — Emergency Department (HOSPITAL_COMMUNITY)
Admission: EM | Admit: 2016-02-15 | Discharge: 2016-02-15 | Disposition: A | Discharge status: Home / Self Care | Payer: BLUE CROSS/BLUE SHIELD | Attending: Emergency Medicine | Admitting: Emergency Medicine

## 2016-02-15 DIAGNOSIS — S61431A Puncture wound without foreign body of right hand, initial encounter: Secondary | ICD-10-CM | POA: Diagnosis not present

## 2016-02-15 DIAGNOSIS — Y9389 Activity, other specified: Secondary | ICD-10-CM | POA: Insufficient documentation

## 2016-02-15 DIAGNOSIS — Z23 Encounter for immunization: Secondary | ICD-10-CM | POA: Insufficient documentation

## 2016-02-15 DIAGNOSIS — X58XXXA Exposure to other specified factors, initial encounter: Secondary | ICD-10-CM | POA: Diagnosis not present

## 2016-02-15 DIAGNOSIS — F17210 Nicotine dependence, cigarettes, uncomplicated: Secondary | ICD-10-CM | POA: Insufficient documentation

## 2016-02-15 DIAGNOSIS — Y999 Unspecified external cause status: Secondary | ICD-10-CM | POA: Diagnosis not present

## 2016-02-15 DIAGNOSIS — S6991XA Unspecified injury of right wrist, hand and finger(s), initial encounter: Secondary | ICD-10-CM | POA: Diagnosis present

## 2016-02-15 DIAGNOSIS — Z791 Long term (current) use of non-steroidal anti-inflammatories (NSAID): Secondary | ICD-10-CM | POA: Diagnosis not present

## 2016-02-15 DIAGNOSIS — Y92009 Unspecified place in unspecified non-institutional (private) residence as the place of occurrence of the external cause: Secondary | ICD-10-CM | POA: Diagnosis not present

## 2016-02-15 MED ORDER — TETANUS-DIPHTH-ACELL PERTUSSIS 5-2.5-18.5 LF-MCG/0.5 IM SUSP
0.5000 mL | Freq: Once | INTRAMUSCULAR | Status: AC
  Administered 2016-02-15: 0.5 mL via INTRAMUSCULAR

## 2016-02-15 MED ORDER — HYDROCODONE-ACETAMINOPHEN 5-325 MG PO TABS
2.0000 | ORAL_TABLET | Freq: Once | ORAL | Status: AC
  Administered 2016-02-15: 2 via ORAL
  Filled 2016-02-15: qty 2

## 2016-02-15 MED ORDER — TETANUS-DIPHTH-ACELL PERTUSSIS 5-2.5-18.5 LF-MCG/0.5 IM SUSP
0.5000 mL | Freq: Once | INTRAMUSCULAR | Status: DC
  Filled 2016-02-15: qty 0.5

## 2016-02-15 MED ORDER — SULFAMETHOXAZOLE-TRIMETHOPRIM 800-160 MG PO TABS: 1.0000 | ORAL_TABLET | Freq: Two times a day (BID) | ORAL | 0 refills | Status: AC

## 2016-02-15 MED ORDER — HYDROCODONE-ACETAMINOPHEN 5-325 MG PO TABS: 2.0000 | ORAL_TABLET | ORAL | 0 refills | Status: DC | PRN

## 2016-02-15 NOTE — ED Provider Notes (Signed)
[facility] DEPT Provider Note   CSN: [id number] Arrival date & time: 02/15/16  9562     History   Chief Complaint Chief Complaint  Patient presents with  . Hand Injury    HPI Hayden Anderson is a 50 y.o. male.  The history is provided by the patient. No language interpreter was used.  Hand Injury   The incident occurred yesterday. The incident occurred at home. There was no injury mechanism. The pain is moderate. The pain has been constant since the incident. Pertinent negatives include no fever. He reports no foreign bodies present. He has tried nothing for the symptoms. The treatment provided no relief.  Pt cut his hand last pm on a microwave corner.  Pt reports fingers feel tingly and numb  History reviewed. No pertinent past medical history.  There are no active problems to display for this patient.   History reviewed. No pertinent surgical history.     Home Medications    Prior to Admission medications   Medication Sig Start Date End Date Taking? Authorizing Provider  ibuprofen (ADVIL,MOTRIN) 800 MG tablet Take 800 mg by mouth every 8 (eight) hours as needed for mild pain.   Yes Historical Provider, MD  HYDROcodone-acetaminophen (NORCO/VICODIN) 5-325 MG tablet Take 2 tablets by mouth every 4 (four) hours as needed. 02/15/16   Elson Areas, PA-C  sulfamethoxazole-trimethoprim (BACTRIM DS,SEPTRA DS) 800-160 MG tablet Take 1 tablet by mouth 2 (two) times daily. 02/15/16 02/22/16  Elson Areas, PA-C    Family History Family History  Problem Relation Age of Onset  . Hypertension Mother   . Hypertension Brother   . Hypertension Other     Social History Social History  Substance Use Topics  . Smoking status: Current Every Day Smoker    Packs/day: 0.50    Years: 25.00    Types: Cigarettes    Last attempt to quit: 05/06/2013  . Smokeless tobacco: Never Used  . Alcohol use Yes     Comment: occasionally     Allergies   Patient has no known  allergies.   Review of Systems Review of Systems  Constitutional: Negative for fever.  All other systems reviewed and are negative.    Physical Exam Updated Vital Signs BP 150/95 (BP Location: Left Arm)   Pulse 76   Temp 97.6 F (36.4 C) (Oral)   Ht 6\' 2"  (1.88 m)   Wt 88.5 kg   SpO2 97%   BMI 25.04 kg/m   Physical Exam  Constitutional: He is oriented to person, place, and time. He appears well-developed and well-nourished.  HENT:  Head: Normocephalic.  Eyes: EOM are normal.  Neck: Normal range of motion.  Pulmonary/Chest: Effort normal.  Abdominal: He exhibits no distension.  Musculoskeletal:  Right hand puncture wound below 2nd finger.  Pain with range of motion,  nv intact   Neurological: He is alert and oriented to person, place, and time.  Psychiatric: He has a normal mood and affect.  Nursing note and vitals reviewed.    ED Treatments / Results  Labs (all labs ordered are listed, but only abnormal results are displayed) Labs Reviewed - No data to display  EKG  EKG Interpretation None       Radiology Dg Hand Complete Right  Result Date: 02/15/2016 CLINICAL DATA:  Puncture wound in the right palm. Numbness to fingers. EXAM: RIGHT HAND - COMPLETE 3+ VIEW COMPARISON:  Wrist x-ray June 21, 2015 FINDINGS: Deformity at the base of the fifth metacarpal  is unchanged consistent with previous trauma. Evaluation is limited as the patient could not straighten his fingers. No acute fractures. No foreign bodies identified. IMPRESSION: No acute abnormality. Electronically Signed   By: Gerome Samavid  Williams III M.D   On: 02/15/2016 10:51    Procedures Procedures (including critical care time)  Medications Ordered in ED Medications  Tdap (BOOSTRIX) injection 0.5 mL (0.5 mLs Intramuscular Given 02/15/16 0956)  HYDROcodone-acetaminophen (NORCO/VICODIN) 5-325 MG per tablet 2 tablet (2 tablets Oral Given 02/15/16 1128)     Initial Impression / Assessment and Plan / ED Course  I  have reviewed the triage vital signs and the nursing notes.  Pertinent labs & imaging results that were available during my care of the patient were reviewed by me and considered in my medical decision making (see chart for details).     Pt concerned he has internal injury.   Pt advised to recheck here for recheck or call and see Dr. Romeo AppleHarrison for recheck  Final Clinical Impressions(s) / ED Diagnoses   Final diagnoses:  Puncture wound of right hand, foreign body presence unspecified, initial encounter    New Prescriptions Discharge Medication List as of 02/15/2016 11:20 AM    START taking these medications   Details  HYDROcodone-acetaminophen (NORCO/VICODIN) 5-325 MG tablet Take 2 tablets by mouth every 4 (four) hours as needed., Starting Sat 02/15/2016, Print    sulfamethoxazole-trimethoprim (BACTRIM DS,SEPTRA DS) 800-160 MG tablet Take 1 tablet by mouth 2 (two) times daily., Starting Sat 02/15/2016, Until Sat 02/22/2016, Print      An After Visit Summary was printed and given to the patient.   Lonia SkinnerLeslie K MargaretSofia, PA-C 02/15/16 1411    Donnetta HutchingBrian Cook, MD 02/16/16 [phone]

## 2016-02-15 NOTE — ED Triage Notes (Signed)
Pt reports picking up microwave last night around 2300, causing puncture wound to R palm.  Pt has numbness to fingers, limited rom to fingers.  Unknown last tetanus shot.

## 2016-02-15 NOTE — Discharge Instructions (Signed)
Return in 2 days for recheck

## 2016-02-15 NOTE — ED Notes (Signed)
Wound to hand irrigated with saline. Wound dressed with telfa, kerlix, and ace bandage-patient tolerated well.

## 2016-07-18 ENCOUNTER — Emergency Department (HOSPITAL_COMMUNITY): Payer: BLUE CROSS/BLUE SHIELD

## 2016-07-18 ENCOUNTER — Emergency Department (HOSPITAL_COMMUNITY)
Admission: EM | Admit: 2016-07-18 | Discharge: 2016-07-18 | Disposition: A | Discharge status: Home / Self Care | Payer: BLUE CROSS/BLUE SHIELD | Attending: Emergency Medicine | Admitting: Emergency Medicine

## 2016-07-18 ENCOUNTER — Encounter (HOSPITAL_COMMUNITY): Payer: Self-pay

## 2016-07-18 DIAGNOSIS — M79641 Pain in right hand: Secondary | ICD-10-CM | POA: Insufficient documentation

## 2016-07-18 DIAGNOSIS — F17210 Nicotine dependence, cigarettes, uncomplicated: Secondary | ICD-10-CM | POA: Insufficient documentation

## 2016-07-18 MED ORDER — DEXAMETHASONE 4 MG PO TABS: 4.0000 mg | ORAL_TABLET | Freq: Two times a day (BID) | ORAL | 0 refills | Status: DC

## 2016-07-18 MED ORDER — IBUPROFEN 600 MG PO TABS: 600.0000 mg | ORAL_TABLET | Freq: Four times a day (QID) | ORAL | 0 refills | Status: DC

## 2016-07-18 NOTE — Discharge Instructions (Signed)
Vital signs are within normal limits. Your x-ray is negative for fracture or dislocation or gas or signs of advancing infection. There are noted areas of arthritis in your hand. Please see Dr. Janee Mornhompson, hand surgery, for evaluation of the pain and numbness in your hand following the puncture injury. Use ibuprofen with breakfast, lunch, dinner, and at bedtime. Use Decadron 2 times daily with food.

## 2016-07-18 NOTE — ED Notes (Signed)
Nail to hand several months ago- complains of increasing pain as well as numbness to his middle finger area

## 2016-07-18 NOTE — ED Triage Notes (Addendum)
Reports of increased pain to right hand at night and while working. Also has numbness x6 months noted to center of right palm that radiates to middle and third finger since nail was in hand 6 months ago.  Patient reports of not allowing wound to heal or follow up after incident prior to going back to work.

## 2016-07-18 NOTE — ED Provider Notes (Signed)
[facility] Provider Note   CSN: [id number] Arrival date & time: 07/18/16  1505     History   Chief Complaint Chief Complaint  Patient presents with  . Hand Pain    HPI Hayden Anderson is a 51 y.o. male.  The patient states that approximately 6 months ago he sustained a puncture wound to the palmar surface of the right hand. He states he had immediate numbness and some difficulty with moving the middle finger and the ring finger on. He was seen in the emergency department at that time. He was treated with an antibiotic and pain medication. Patient states that he drinks alcohol and so he did not take the antibiotic medication. He thought that it would heal and he would get back to using his finger without problem. He was referred to one of the hand specialist, but did not go because he said he could not afford to be off of his job. He states it is now been 6 months and he cannot make a tight fist with his right hand. He mostly uses his thumb and index finger and his job because using the other fingers causes numbness and pain and swelling. He states the problem seems to be getting worse and he thought that he should come to the emergency department for additional evaluation and management.   The history is provided by the patient.  Hand Pain  Pertinent negatives include no chest pain, no abdominal pain and no shortness of breath.    History reviewed. No pertinent past medical history.  There are no active problems to display for this patient.   History reviewed. No pertinent surgical history.     Home Medications    Prior to Admission medications   Medication Sig Start Date End Date Taking? Authorizing Provider  HYDROcodone-acetaminophen (NORCO/VICODIN) 5-325 MG tablet Take 2 tablets by mouth every 4 (four) hours as needed. 02/15/16   Elson Areas, PA-C  ibuprofen (ADVIL,MOTRIN) 800 MG tablet Take 800 mg by mouth every 8 (eight) hours as needed for mild pain.     [provider]    Family History Family History  Problem Relation Age of Onset  . Hypertension Mother   . Hypertension Brother   . Hypertension Other     Social History Social History  Substance Use Topics  . Smoking status: Current Every Day Smoker    Packs/day: 0.50    Years: 25.00    Types: Cigarettes    Last attempt to quit: 05/06/2013  . Smokeless tobacco: Never Used  . Alcohol use Yes     Comment: occasionally     Allergies   Patient has no known allergies.   Review of Systems Review of Systems  Constitutional: Negative for activity change.       All ROS Neg except as noted in HPI  HENT: Negative for nosebleeds.   Eyes: Negative for photophobia and discharge.  Respiratory: Negative for cough, shortness of breath and wheezing.   Cardiovascular: Negative for chest pain and palpitations.  Gastrointestinal: Negative for abdominal pain and blood in stool.  Genitourinary: Negative for dysuria, frequency and hematuria.  Musculoskeletal: Positive for arthralgias. Negative for back pain and neck pain.  Skin: Negative.   Neurological: Negative for dizziness, seizures and speech difficulty.  Psychiatric/Behavioral: Negative for confusion and hallucinations.     Physical Exam Updated Vital Signs There were no vitals taken for this visit.  Physical Exam  Constitutional: He is oriented to person, place, and time.  He appears well-developed and well-nourished.  Non-toxic appearance.  HENT:  Head: Normocephalic.  Right Ear: Tympanic membrane and external ear normal.  Left Ear: Tympanic membrane and external ear normal.  Eyes: EOM and lids are normal. Pupils are equal, round, and reactive to light.  Neck: Normal range of motion. Neck supple. Carotid bruit is not present.  Cardiovascular: Normal rate, regular rhythm, normal heart sounds, intact distal pulses and normal pulses.   Pulmonary/Chest: Breath sounds normal. No respiratory distress.  Abdominal: Soft.  Bowel sounds are normal. There is no tenderness. There is no guarding.  Musculoskeletal: Normal range of motion.  There is full range of motion of the right shoulder, wrist, and elbow. The patient has problems making a tight fist because of decrease in range of motion of the middle finger and ring finger. He has pain to palpation at the base of the palmar surface of the middle finger and ring finger. No hot areas appreciated. No hot joints noted.  Lymphadenopathy:       Head (right side): No submandibular adenopathy present.       Head (left side): No submandibular adenopathy present.    He has no cervical adenopathy.  Neurological: He is alert and oriented to person, place, and time. He has normal strength. No cranial nerve deficit or sensory deficit.  No gross motor or sensory deficit appreciated.  Skin: Skin is warm and dry.  Psychiatric: He has a normal mood and affect. His speech is normal.  Nursing note and vitals reviewed.    ED Treatments / Results  Labs (all labs ordered are listed, but only abnormal results are displayed) Labs Reviewed - No data to display  EKG  EKG Interpretation None       Radiology No results found.  Procedures Procedures (including critical care time)  Medications Ordered in ED Medications - No data to display   Initial Impression / Assessment and Plan / ED Course  I have reviewed the triage vital signs and the nursing notes.  Pertinent labs & imaging results that were available during my care of the patient were reviewed by me and considered in my medical decision making (see chart for details).       Final Clinical Impressions(s) / ED Diagnoses MDM Vital signs reviewed. X-ray of the right hand show some arthritis changes, but no fracture and no dislocation. There is no gas or signs of advancing infection.  I've advised the patient to see Dr. Janee Mornhompson for hand surgery evaluation and management. I discussed the findings on the exam and  the findings on the x-ray with the patient in terms which he understands.    Final diagnoses:  Hand pain, right    New Prescriptions New Prescriptions   DEXAMETHASONE (DECADRON) 4 MG TABLET    Take 1 tablet (4 mg total) by mouth 2 (two) times daily with a meal.   IBUPROFEN (ADVIL,MOTRIN) 600 MG TABLET    Take 1 tablet (600 mg total) by mouth 4 (four) times daily.     Ivery QualeBryant, Vedanshi Massaro, PA-C 07/18/16 1659    Eber HongMiller, Brian, MD 07/18/16 [phone]

## 2017-05-02 ENCOUNTER — Other Ambulatory Visit: Payer: Self-pay

## 2017-05-02 ENCOUNTER — Emergency Department (HOSPITAL_COMMUNITY)
Admission: EM | Admit: 2017-05-02 | Discharge: 2017-05-02 | Disposition: A | Discharge status: Home / Self Care | Payer: BLUE CROSS/BLUE SHIELD | Attending: Emergency Medicine | Admitting: Emergency Medicine

## 2017-05-02 ENCOUNTER — Encounter (HOSPITAL_COMMUNITY): Payer: Self-pay | Admitting: Emergency Medicine

## 2017-05-02 ENCOUNTER — Emergency Department (HOSPITAL_COMMUNITY): Payer: BLUE CROSS/BLUE SHIELD

## 2017-05-02 DIAGNOSIS — M5441 Lumbago with sciatica, right side: Secondary | ICD-10-CM | POA: Diagnosis not present

## 2017-05-02 DIAGNOSIS — M5489 Other dorsalgia: Secondary | ICD-10-CM | POA: Diagnosis present

## 2017-05-02 DIAGNOSIS — F17210 Nicotine dependence, cigarettes, uncomplicated: Secondary | ICD-10-CM | POA: Diagnosis not present

## 2017-05-02 MED ORDER — NAPROXEN 375 MG PO TABS: 375.0000 mg | ORAL_TABLET | Freq: Two times a day (BID) | ORAL | 0 refills | Status: DC

## 2017-05-02 MED ORDER — METHOCARBAMOL 500 MG PO TABS: 500.0000 mg | ORAL_TABLET | Freq: Two times a day (BID) | ORAL | 0 refills | Status: DC

## 2017-05-02 MED ORDER — NAPROXEN 250 MG PO TABS
375.0000 mg | ORAL_TABLET | Freq: Once | ORAL | Status: AC
  Administered 2017-05-02: 375 mg via ORAL
  Filled 2017-05-02: qty 2

## 2017-05-02 NOTE — ED Provider Notes (Signed)
[facility] EMERGENCY DEPARTMENT Provider Note   CSN: [id number] Arrival date & time: 05/02/17  1457     History   Chief Complaint Chief Complaint  Patient presents with  . Back Pain    HPI Hayden Anderson is a 52 y.o. male who presents for evaluation of lower lumbar back pain that is been ongoing for several weeks.  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  Patient states that back pain began after this job.  He denies any specific trauma, injury, fall.  He has been taking ibuprofen for pain and states he has had some improvement in symptoms after ibuprofen use.  Patient reports that today, his pain is actually improved but he thought he would get it evaluated before he goes back to work tomorrow.  Patient states that he also has some pain that runs on the posterior aspect of his right lower leg.  He has been able to ambulate without any difficulty. Denies fevers, weight loss, numbness/weakness of upper and lower extremities, bowel/bladder incontinence, saddle anesthesia, history of back surgery, history of IVDA.   The history is provided by the patient.    History reviewed. No pertinent past medical history.  There are no active problems to display for this patient.   History reviewed. No pertinent surgical history.      Home Medications    Prior to Admission medications   Medication Sig Start Date End Date Taking? Authorizing Provider  dexamethasone (DECADRON) 4 MG tablet Take 1 tablet (4 mg total) by mouth 2 (two) times daily with a meal. 07/18/16   Ivery QualeBryant, Hobson, PA-C  HYDROcodone-acetaminophen (NORCO/VICODIN) 5-325 MG tablet Take 2 tablets by mouth every 4 (four) hours as needed. 02/15/16   Elson AreasSofia, Leslie K, PA-C  ibuprofen (ADVIL,MOTRIN) 600 MG tablet Take 1 tablet (600 mg total) by mouth 4 (four) times daily. 07/18/16   Ivery QualeBryant, Hobson, PA-C  methocarbamol (ROBAXIN) 500 MG tablet Take 1 tablet (500 mg total) by mouth 2 (two) times daily.  05/02/17   Maxwell CaulLayden, Sarann Tregre A, PA-C  naproxen (NAPROSYN) 375 MG tablet Take 1 tablet (375 mg total) by mouth 2 (two) times daily. 05/02/17   Maxwell CaulLayden, Karl Erway A, PA-C    Family History Family History  Problem Relation Age of Onset  . Hypertension Mother   . Hypertension Brother   . Hypertension Other     Social History Social History   Tobacco Use  . Smoking status: Current Every Day Smoker    Packs/day: 1.00    Years: 25.00    Pack years: 25.00    Types: Cigarettes    Last attempt to quit: 05/06/2013    Years since quitting: 3.9  . Smokeless tobacco: Never Used  Substance Use Topics  . Alcohol use: Yes    Comment: weekends  . Drug use: No     Allergies   Patient has no known allergies.   Review of Systems Review of Systems  Constitutional: Negative for fever.  Respiratory: Negative for shortness of breath.   Cardiovascular: Negative for chest pain.  Genitourinary: Negative for dysuria and hematuria.  Musculoskeletal: Positive for back pain.  Neurological: Negative for weakness, numbness and headaches.     Physical Exam Updated Vital Signs BP 134/80   Pulse 60   Temp 98.3 F (36.8 C) (Oral)   Resp 16   Ht 6\' 2"  (1.88 m)   Wt 86.2 kg (190 lb)   SpO2 100%   BMI 24.39 kg/m  Physical Exam  Constitutional: He appears well-developed and well-nourished.  HENT:  Head: Normocephalic and atraumatic.  Eyes: Conjunctivae and EOM are normal. Right eye exhibits no discharge. Left eye exhibits no discharge. No scleral icterus.  Cardiovascular:  Pulses:      Dorsalis pedis pulses are 2+ on the right side, and 2+ on the left side.  Pulmonary/Chest: Effort normal.  Musculoskeletal:       Cervical back: He exhibits no tenderness.       Thoracic back: He exhibits no tenderness.       Back:  Diffuse muscular tenderness palpation overlying the entire lumbar region that extends over the midline.  No deformity or crepitus noted.  Neurological: He is alert.  Follows  commands, Moves all extremities  5/5 strength to BUE and BLE  Sensation intact throughout all major nerve distributions Normal gait  Skin: Skin is warm and dry. Capillary refill takes less than 2 seconds.  Good distal cap refill. RLE is not dusky in appearance or cool to touch.  Psychiatric: He has a normal mood and affect. His speech is normal and behavior is normal.  Nursing note and vitals reviewed.    ED Treatments / Results  Labs (all labs ordered are listed, but only abnormal results are displayed) Labs Reviewed - No data to display  EKG None  Radiology Dg Lumbar Spine Complete  Result Date: 05/02/2017 CLINICAL DATA:  Low back pain for several weeks, no known injury, initial encounter EXAM: LUMBAR SPINE - COMPLETE 4+ VIEW COMPARISON:  None. FINDINGS: Five lumbar type vertebral bodies are well visualized. Vertebral body height is well maintained. No pars defects are noted. No anterolisthesis is seen. No soft tissue abnormality is noted. IMPRESSION: No acute abnormality noted. Electronically Signed   By: Alcide Clever M.D.   On: 05/02/2017 15:57    Procedures Procedures (including critical care time)  Medications Ordered in ED Medications  naproxen (NAPROSYN) tablet 375 mg (375 mg Oral Given 05/02/17 1553)     Initial Impression / Assessment and Plan / ED Course  I have reviewed the triage vital signs and the nursing notes.  Pertinent labs & imaging results that were available during my care of the patient were reviewed by me and considered in my medical decision making (see chart for details).     52 year old male who presents for evaluation of lower back pain that is been ongoing for the last few weeks.  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  Reports pain started after a new job. Patient is afebrile, non-toxic appearing, sitting comfortably on examination table. Vital signs reviewed and stable. No neuro deficits noted on exam. Patient is neurovascularly  intact.  Patient no red flag symptoms.  Exam is reassuring.  Patient has diffuse tenderness over the entire lumbar region.  Also's positive straight leg raise on the left.  Consider musculoskeletal pain versus sciatica.  History/physical exam is not concerning for cauda equina, spinal abscess, aortic dissection.  Given his distribution of pain, will plan for x-ray evaluation.  X-ray reviewed.  Negative for any acute abnormalities.  Discussed results with patient.  He is ambulating in the part without any difficulty.  Repeat vital signs improved.  We will plan to provide symptomatic relief for patient.  Instructed him to follow-up with primary care or follow-up with the clinic and establish primary care. Patient had ample opportunity for questions and discussion. All patient's questions were answered with full understanding. Strict return precautions discussed. Patient expresses understanding  and agreement to plan.   Final Clinical Impressions(s) / ED Diagnoses   Final diagnoses:  Acute bilateral low back pain with right-sided sciatica    ED Discharge Orders        Ordered    naproxen (NAPROSYN) 375 MG tablet  2 times daily     05/02/17 1615    methocarbamol (ROBAXIN) 500 MG tablet  2 times daily     05/02/17 1615       Maxwell Caul, PA-C 05/02/17 1626    Samuel Jester, DO 05/02/17 1749

## 2017-05-02 NOTE — Discharge Instructions (Signed)
You continue Naprosyn as directed.  Take Robaxin as prescribed. This medication will make you drowsy so do not drive or drink alcohol when taking it.  Make sure you are doing gentle range of motion exercises.  He can apply heat to the affected areas.  He can also buy lidocaine patches at the drugstore to help with pain.  Follow-up with referred clinic for primary care establishment.  Return to the Emergency Department immediately for any worsening back pain, neck pain, difficulty walking, numbness/weaknss of your arms or legs, urinary or bowel accidents, fever or any other worsening or concerning symptoms.

## 2017-05-02 NOTE — ED Triage Notes (Signed)
Pt reports lower back pain for several weeks, denies injury. 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 Denies bowel/bladder dysfunction, or GU sx.

## 2017-07-24 ENCOUNTER — Emergency Department (HOSPITAL_COMMUNITY): Payer: BLUE CROSS/BLUE SHIELD

## 2017-07-24 ENCOUNTER — Other Ambulatory Visit: Payer: Self-pay

## 2017-07-24 ENCOUNTER — Emergency Department (HOSPITAL_COMMUNITY)
Admission: EM | Admit: 2017-07-24 | Discharge: 2017-07-24 | Disposition: A | Discharge status: Home / Self Care | Payer: BLUE CROSS/BLUE SHIELD | Attending: Emergency Medicine | Admitting: Emergency Medicine

## 2017-07-24 ENCOUNTER — Encounter (HOSPITAL_COMMUNITY): Payer: Self-pay | Admitting: Emergency Medicine

## 2017-07-24 DIAGNOSIS — S20211A Contusion of right front wall of thorax, initial encounter: Secondary | ICD-10-CM | POA: Insufficient documentation

## 2017-07-24 DIAGNOSIS — F17210 Nicotine dependence, cigarettes, uncomplicated: Secondary | ICD-10-CM | POA: Insufficient documentation

## 2017-07-24 DIAGNOSIS — Y998 Other external cause status: Secondary | ICD-10-CM | POA: Insufficient documentation

## 2017-07-24 DIAGNOSIS — Y929 Unspecified place or not applicable: Secondary | ICD-10-CM | POA: Diagnosis not present

## 2017-07-24 DIAGNOSIS — Y9389 Activity, other specified: Secondary | ICD-10-CM | POA: Insufficient documentation

## 2017-07-24 DIAGNOSIS — S299XXA Unspecified injury of thorax, initial encounter: Secondary | ICD-10-CM | POA: Diagnosis present

## 2017-07-24 DIAGNOSIS — M542 Cervicalgia: Secondary | ICD-10-CM | POA: Insufficient documentation

## 2017-07-24 NOTE — ED Notes (Signed)
Patient transported to CT and X ray 
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
## 2017-07-24 NOTE — Discharge Instructions (Addendum)
X-rays were normal.  You will be sore for several days.  Tylenol and/or ibuprofen for pain.

## 2018-02-19 ENCOUNTER — Emergency Department (HOSPITAL_COMMUNITY)
Admission: EM | Admit: 2018-02-19 | Discharge: 2018-02-19 | Disposition: A | Discharge status: Home / Self Care | Payer: BLUE CROSS/BLUE SHIELD | Attending: Emergency Medicine | Admitting: Emergency Medicine

## 2018-02-19 ENCOUNTER — Encounter (HOSPITAL_COMMUNITY): Payer: Self-pay

## 2018-02-19 ENCOUNTER — Other Ambulatory Visit: Payer: Self-pay

## 2018-02-19 ENCOUNTER — Emergency Department (HOSPITAL_COMMUNITY): Payer: BLUE CROSS/BLUE SHIELD

## 2018-02-19 DIAGNOSIS — Y999 Unspecified external cause status: Secondary | ICD-10-CM | POA: Insufficient documentation

## 2018-02-19 DIAGNOSIS — Y9389 Activity, other specified: Secondary | ICD-10-CM | POA: Insufficient documentation

## 2018-02-19 DIAGNOSIS — Y929 Unspecified place or not applicable: Secondary | ICD-10-CM | POA: Insufficient documentation

## 2018-02-19 DIAGNOSIS — S20219A Contusion of unspecified front wall of thorax, initial encounter: Secondary | ICD-10-CM | POA: Diagnosis not present

## 2018-02-19 DIAGNOSIS — S20213A Contusion of bilateral front wall of thorax, initial encounter: Secondary | ICD-10-CM

## 2018-02-19 DIAGNOSIS — F17210 Nicotine dependence, cigarettes, uncomplicated: Secondary | ICD-10-CM | POA: Insufficient documentation

## 2018-02-19 DIAGNOSIS — T148XXA Other injury of unspecified body region, initial encounter: Secondary | ICD-10-CM

## 2018-02-19 DIAGNOSIS — S299XXA Unspecified injury of thorax, initial encounter: Secondary | ICD-10-CM | POA: Diagnosis present
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
## 2018-02-19 NOTE — Discharge Instructions (Signed)
Your x-ray of the right and left ribs, as well as the chest negative for fracture or dislocation.  There is no evidence of any injury to the lungs.  Your examination does suggest some muscle strain at multiple areas.  Please use Tylenol every 4 hours, or ibuprofen every 6 hours for soreness.  Please see your primary physician or return to the emergency department if any changes in your condition, problems, or concerns.

## 2018-03-06 ENCOUNTER — Encounter (HOSPITAL_COMMUNITY): Payer: Self-pay

## 2018-03-06 ENCOUNTER — Emergency Department (HOSPITAL_COMMUNITY)
Admission: EM | Admit: 2018-03-06 | Discharge: 2018-03-06 | Disposition: A | Discharge status: Home / Self Care | Payer: BLUE CROSS/BLUE SHIELD | Attending: Emergency Medicine | Admitting: Emergency Medicine

## 2018-03-06 ENCOUNTER — Other Ambulatory Visit: Payer: Self-pay

## 2018-03-06 DIAGNOSIS — Z5321 Procedure and treatment not carried out due to patient leaving prior to being seen by health care provider: Secondary | ICD-10-CM | POA: Diagnosis not present

## 2018-03-06 DIAGNOSIS — M549 Dorsalgia, unspecified: Secondary | ICD-10-CM | POA: Diagnosis present

## 2018-03-06 NOTE — ED Notes (Signed)
Pt stated he waited long enough. And left without being seen

## 2018-03-06 NOTE — ED Notes (Signed)
Apologized for the wait. Informed pt that md will be in room when he can
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
## 2018-03-20 ENCOUNTER — Encounter (HOSPITAL_COMMUNITY): Payer: Self-pay | Admitting: Emergency Medicine

## 2018-03-20 ENCOUNTER — Emergency Department (HOSPITAL_COMMUNITY)
Admission: EM | Admit: 2018-03-20 | Discharge: 2018-03-20 | Disposition: A | Discharge status: Home / Self Care | Payer: BLUE CROSS/BLUE SHIELD | Attending: Emergency Medicine | Admitting: Emergency Medicine

## 2018-03-20 ENCOUNTER — Other Ambulatory Visit: Payer: Self-pay

## 2018-03-20 DIAGNOSIS — M545 Low back pain: Secondary | ICD-10-CM | POA: Diagnosis present

## 2018-03-20 DIAGNOSIS — M5441 Lumbago with sciatica, right side: Secondary | ICD-10-CM | POA: Insufficient documentation

## 2018-03-20 DIAGNOSIS — F17210 Nicotine dependence, cigarettes, uncomplicated: Secondary | ICD-10-CM | POA: Insufficient documentation

## 2018-03-20 DIAGNOSIS — M5431 Sciatica, right side: Secondary | ICD-10-CM

## 2018-03-20 HISTORY — DX: Low back pain, unspecified: M54.50

## 2018-03-20 HISTORY — DX: Low back pain: M54.5

## 2018-03-20 MED ORDER — CYCLOBENZAPRINE HCL 10 MG PO TABS
10.0000 mg | ORAL_TABLET | Freq: Once | ORAL | Status: AC
  Administered 2018-03-20: 10 mg via ORAL
  Filled 2018-03-20: qty 1

## 2018-03-20 MED ORDER — PREDNISONE 10 MG PO TABS: ORAL_TABLET | ORAL | 0 refills | Status: DC

## 2018-03-20 MED ORDER — HYDROCODONE-ACETAMINOPHEN 5-325 MG PO TABS: ORAL_TABLET | ORAL | 0 refills | Status: DC

## 2018-03-20 MED ORDER — HYDROCODONE-ACETAMINOPHEN 5-325 MG PO TABS
1.0000 | ORAL_TABLET | Freq: Once | ORAL | Status: AC
  Administered 2018-03-20: 1 via ORAL
  Filled 2018-03-20: qty 1

## 2018-03-20 MED ORDER — CYCLOBENZAPRINE HCL 10 MG PO TABS: 10.0000 mg | ORAL_TABLET | Freq: Three times a day (TID) | ORAL | 0 refills | Status: DC | PRN
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
## 2018-03-20 NOTE — ED Triage Notes (Signed)
Patient c/o low back pain after moving furniture yesterday. Patient states pain radiates into right leg. Patient taking ibuprofen with some relie., last took 2 hours ago. CNS intact. Patient able to ambulate. Denies any complications with BMs or urination.

## 2018-03-20 NOTE — Discharge Instructions (Signed)
Alternate ice and heat to your lower back.  Avoid bending over, heavy lifting, or twisting movements for at least 1 week.  Follow-up with 1 of the clinics or orthopedic provider listed.  ER for any worsening symptoms.

## 2018-03-27 ENCOUNTER — Emergency Department (HOSPITAL_COMMUNITY)
Admission: EM | Admit: 2018-03-27 | Discharge: 2018-03-27 | Disposition: A | Discharge status: Home / Self Care | Payer: BLUE CROSS/BLUE SHIELD | Attending: Emergency Medicine | Admitting: Emergency Medicine

## 2018-03-27 ENCOUNTER — Other Ambulatory Visit: Payer: Self-pay

## 2018-03-27 ENCOUNTER — Encounter (HOSPITAL_COMMUNITY): Payer: Self-pay | Admitting: Emergency Medicine

## 2018-03-27 DIAGNOSIS — X500XXA Overexertion from strenuous movement or load, initial encounter: Secondary | ICD-10-CM | POA: Insufficient documentation

## 2018-03-27 DIAGNOSIS — G8929 Other chronic pain: Secondary | ICD-10-CM | POA: Insufficient documentation

## 2018-03-27 DIAGNOSIS — Y9389 Activity, other specified: Secondary | ICD-10-CM | POA: Insufficient documentation

## 2018-03-27 DIAGNOSIS — Y999 Unspecified external cause status: Secondary | ICD-10-CM | POA: Insufficient documentation

## 2018-03-27 DIAGNOSIS — F17210 Nicotine dependence, cigarettes, uncomplicated: Secondary | ICD-10-CM | POA: Insufficient documentation

## 2018-03-27 DIAGNOSIS — M545 Low back pain, unspecified: Secondary | ICD-10-CM

## 2018-03-27 DIAGNOSIS — Y92009 Unspecified place in unspecified non-institutional (private) residence as the place of occurrence of the external cause: Secondary | ICD-10-CM | POA: Diagnosis not present

## 2018-03-27 MED ORDER — METHOCARBAMOL 500 MG PO TABS: 500.0000 mg | ORAL_TABLET | Freq: Three times a day (TID) | ORAL | 0 refills | Status: DC

## 2018-03-27 MED ORDER — LIDOCAINE 5 % EX PTCH: 1.0000 | MEDICATED_PATCH | CUTANEOUS | 0 refills | Status: AC

## 2018-03-27 MED ORDER — LIDOCAINE 5 % EX PTCH
1.0000 | MEDICATED_PATCH | CUTANEOUS | Status: DC
  Administered 2018-03-27: 1 via TRANSDERMAL
  Filled 2018-03-27: qty 1

## 2018-03-27 MED ORDER — KETOROLAC TROMETHAMINE 15 MG/ML IJ SOLN
15.0000 mg | Freq: Once | INTRAMUSCULAR | Status: AC
  Administered 2018-03-27: 15 mg via INTRAMUSCULAR
  Filled 2018-03-27: qty 1
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
## 2018-03-27 NOTE — ED Provider Notes (Signed)
[facility] EMERGENCY DEPARTMENT Provider Note   CSN: [id number] Arrival date & time: 03/27/18  1119    History   Chief Complaint Chief Complaint  Patient presents with   Back Pain    HPI Hayden Anderson is a 53 y.o. male presenting today for right-sided lower back pain.  Patient reports that he was helping his mother move yesterday lifting furniture when he had gradual onset of pain after he was done working.  Describes pain as a moderate throbbing sensation to his lower right back constant worsened with movement and palpation and without alleviating factor.  Patient with history of chronic low back pain with sciatica.  He denies abnormal feature of his pain today.  He denies sciatic-like symptoms or radiation of pain.  He denies saddle area paresthesias, bowel/bladder incontinence, urinary retention, fever/chills, IV drug use or numbness/weakness or tingling.  Patient denies recent fall or trauma.  Patient denies any other complaints today.     HPI  Past Medical History:  Diagnosis Date   Low back pain     There are no active problems to display for this patient.   History reviewed. No pertinent surgical history.      Home Medications    Prior to Admission medications   Medication Sig Start Date End Date Taking? Authorizing Provider  cyclobenzaprine (FLEXERIL) 10 MG tablet Take 1 tablet (10 mg total) by mouth 3 (three) times daily as needed. 03/20/18   Triplett, Tammy, PA-C  HYDROcodone-acetaminophen (NORCO/VICODIN) 5-325 MG tablet Take one tab po q 4 hrs prn pain 03/20/18   Triplett, Tammy, PA-C  ibuprofen (ADVIL,MOTRIN) 200 MG tablet Take 800 mg by mouth every 6 (six) hours as needed for moderate pain.    [provider]  lidocaine (LIDODERM) 5 % Place 1 patch onto the skin daily. Remove & Discard patch within 12 hours or as directed by MD 03/27/18   Bill Salinas, PA-C  methocarbamol (ROBAXIN) 500 MG tablet Take 1 tablet (500 mg total) by  mouth 3 (three) times daily. 03/27/18   Bill Salinas, PA-C  predniSONE (DELTASONE) 10 MG tablet Take 6 tablets day one, 5 tablets day two, 4 tablets day three, 3 tablets day four, 2 tablets day five, then 1 tablet day six 03/20/18   Triplett, Tammy, PA-C    Family History Family History  Problem Relation Age of Onset   Hypertension Mother    Hypertension Brother    Hypertension Other     Social History Social History   Tobacco Use   Smoking status: Current Every Day Smoker    Packs/day: 1.00    Years: 25.00    Pack years: 25.00    Types: Cigarettes    Last attempt to quit: 05/06/2013    Years since quitting: 4.8   Smokeless tobacco: Never Used  Substance Use Topics   Alcohol use: Yes    Comment: weekends   Drug use: No     Allergies   Patient has no known allergies.   Review of Systems Review of Systems  Constitutional: Negative.  Negative for chills and fever.  Cardiovascular: Negative.  Negative for chest pain.  Gastrointestinal: Negative.  Negative for abdominal pain, nausea and vomiting.  Genitourinary: Negative.  Negative for dysuria and hematuria.  Musculoskeletal: Positive for back pain. Negative for neck pain.  Neurological: Negative.  Negative for weakness, numbness and headaches.       Denies bowel/bladder incontinence Denies saddle area paresthesias Denies urinary retention  Physical Exam Updated Vital Signs BP (!) 153/85 (BP Location: Right Arm)    Pulse 68    Temp 97.9 F (36.6 C) (Oral)    Resp 18    SpO2 100%   Physical Exam Constitutional:      General: He is not in acute distress.    Appearance: Normal appearance. He is not ill-appearing or diaphoretic.  HENT:     Head: Normocephalic and atraumatic. No raccoon eyes or Battle's sign.     Jaw: There is normal jaw occlusion. No trismus.     Right Ear: External ear normal.     Left Ear: Tympanic membrane and external ear normal.     Nose: Nose normal.     Mouth/Throat:     Lips:  Pink.     Mouth: Mucous membranes are moist.     Pharynx: Oropharynx is clear. Uvula midline.  Eyes:     General: Vision grossly intact. Gaze aligned appropriately.     Conjunctiva/sclera: Conjunctivae normal.     Pupils: Pupils are equal, round, and reactive to light.  Neck:     Musculoskeletal: Normal range of motion and neck supple. No neck rigidity, spinous process tenderness or muscular tenderness.     Trachea: Trachea and phonation normal. No tracheal tenderness or tracheal deviation.  Cardiovascular:     Rate and Rhythm: Normal rate and regular rhythm.     Pulses:          Radial pulses are 2+ on the right side and 2+ on the left side.       Dorsalis pedis pulses are 2+ on the right side and 2+ on the left side.       Posterior tibial pulses are 2+ on the right side and 2+ on the left side.     Heart sounds: Normal heart sounds.  Pulmonary:     Effort: Pulmonary effort is normal. No respiratory distress.     Breath sounds: Normal breath sounds and air entry. No decreased breath sounds or rhonchi.  Abdominal:     General: Bowel sounds are normal. There is no distension.     Palpations: Abdomen is soft. There is no pulsatile mass.     Tenderness: There is no abdominal tenderness. There is no right CVA tenderness, left CVA tenderness, guarding or rebound.  Musculoskeletal:     Right hip: He exhibits normal range of motion, normal strength and no tenderness.     Left hip: He exhibits normal range of motion, normal strength and no tenderness.       Back:     Comments: No midline C/T/L spinal tenderness to palpation no deformity, crepitus, or step-off noted. No sign of injury to the neck or back.  Patient with consistently reproducible tenderness of the right lumbar musculature.  Positive straight leg lift right side.  Feet:     Right foot:     Protective Sensation: 5 sites tested. 5 sites sensed.     Left foot:     Protective Sensation: 5 sites tested. 5 sites sensed.  Skin:     General: Skin is warm and dry.     Capillary Refill: Capillary refill takes less than 2 seconds.  Neurological:     Mental Status: He is alert.     GCS: GCS eye subscore is 4. GCS verbal subscore is 5. GCS motor subscore is 6.     Comments: Speech is clear and goal oriented, follows commands Major Cranial nerves without deficit, no facial  droop Normal strength in upper and lower extremities bilaterally including dorsiflexion and plantar flexion, strong and equal grip strength Sensation normal to light and sharp touch Deep tendon reflexes 2+ bilateral patella, no clonus of the feet Moves extremities without ataxia, coordination intact Normal gait  Psychiatric:        Behavior: Behavior is cooperative.    ED Treatments / Results  Labs (all labs ordered are listed, but only abnormal results are displayed) Labs Reviewed - No data to display  EKG None  Radiology No results found.  Procedures Procedures (including critical care time)  Medications Ordered in ED Medications  lidocaine (LIDODERM) 5 % 1 patch (1 patch Transdermal Patch Applied 03/27/18 1231)  ketorolac (TORADOL) 15 MG/ML injection 15 mg (15 mg Intramuscular Given 03/27/18 1231)     Initial Impression / Assessment and Plan / ED Course  I have reviewed the triage vital signs and the nursing notes.  Pertinent labs & imaging results that were available during my care of the patient were reviewed by me and considered in my medical decision making (see chart for details).    Hayden Anderson is a 53 y.o. male presenting with exacerbation of his chronic right-sided lower back pain after helping move furniture yesterday.  Pain with gradual onset after moving.  He describes his pain as his typical back pain however he denies radicular symptoms today.  Patient denies history of trauma, fever, IV drug use, night sweats, weight loss, cancer, saddle anesthesia, urinary rentention, bowel/bladder incontinence. No neurological  deficits and normal neuro exam.   Suspect musculoskeletal etiology of patient's pain. Pain is consistently reproducible with palpation of the back musculature. Abdomen soft/nontender and without pulsatile mass. Patient with equal pedal pulses. Doubt spinal epidural abscess, cauda equina or AAA. Imaging not indicated at this time.  Patient is ambulatory in the emergency department without assistance. RICE protocol and pain medicine indicated and discussed with patient.   15 mg Toradol intramuscular given.  Patient denies history of CKD or gastric ulcers/bleeding. Robaxin 500mg  TID prescribed. Patient informed to avoid driving or operating heavy machinery while taking muscle relaxer.  At this time there does not appear to be any evidence of an acute emergency medical condition and the patient appears stable for discharge with appropriate outpatient follow up. Diagnosis was discussed with patient who verbalizes understanding of care plan and is agreeable to discharge. I have discussed return precautions with patient who verbalizes understanding of return precautions. Patient encouraged to follow-up with their PCP. All questions answered. Patient has been discharged in good condition.   Note: Portions of this report may have been transcribed using voice recognition software. Every effort was made to ensure accuracy; however, inadvertent computerized transcription errors may still be present. Final Clinical Impressions(s) / ED Diagnoses   Final diagnoses:  Chronic right-sided low back pain without sciatica    ED Discharge Orders         Ordered    methocarbamol (ROBAXIN) 500 MG tablet  3 times daily     03/27/18 1220    lidocaine (LIDODERM) 5 %  Every 24 hours     03/27/18 1220           Elizabeth Palau 03/27/18 1247    Arby Barrette, MD 03/27/18 1439

## 2018-03-27 NOTE — Discharge Instructions (Signed)
You have been diagnosed today with acute on chronic right-sided lower back pain without sciatica.  At this time there does not appear to be the presence of an emergent medical condition, however there is always the potential for conditions to change. Please read and follow the below instructions.  Please return to the Emergency Department immediately for any new or worsening symptoms. Please be sure to follow up with your Primary Care Provider within one week regarding your visit today; please call their office to schedule an appointment even if you are feeling better for a follow-up visit. You have been given an anti-inflammatory shot today called Toradol.  Please avoid other NSAID medication such as ibuprofen and Motrin for the next 2 days.  Please drink plenty of water over the next few days. Additionally you may use the muscle relaxer Robaxin as prescribed to help with your symptoms.  Do not drive or operate machinery will take this medication as it may make you sleepy. You may use the Lidoderm patch as prescribed today to help with your symptoms.  Heating pads may also be helpful.  Get help right away if: You develop new bowel or bladder control problems. You have unusual weakness or numbness in your arms or legs. You develop nausea or vomiting. You develop abdominal pain. You feel faint. You have fever or chills Your back pain is different compared to normal. Any new or concerning symptoms  Please read the additional information packets attached to your discharge summary.  Do not take your medicine if  develop an itchy rash, swelling in your mouth or lips, or difficulty breathing.

## 2018-04-26 ENCOUNTER — Encounter (HOSPITAL_COMMUNITY): Payer: Self-pay | Admitting: *Deleted

## 2018-04-26 ENCOUNTER — Other Ambulatory Visit: Payer: Self-pay

## 2018-04-26 ENCOUNTER — Emergency Department (HOSPITAL_COMMUNITY)
Admission: EM | Admit: 2018-04-26 | Discharge: 2018-04-26 | Disposition: A | Discharge status: Home / Self Care | Payer: BLUE CROSS/BLUE SHIELD | Attending: Emergency Medicine | Admitting: Emergency Medicine

## 2018-04-26 DIAGNOSIS — S0990XA Unspecified injury of head, initial encounter: Secondary | ICD-10-CM

## 2018-04-26 DIAGNOSIS — S0101XA Laceration without foreign body of scalp, initial encounter: Secondary | ICD-10-CM | POA: Insufficient documentation

## 2018-04-26 DIAGNOSIS — Z23 Encounter for immunization: Secondary | ICD-10-CM | POA: Insufficient documentation

## 2018-04-26 DIAGNOSIS — Y929 Unspecified place or not applicable: Secondary | ICD-10-CM | POA: Insufficient documentation

## 2018-04-26 DIAGNOSIS — F17210 Nicotine dependence, cigarettes, uncomplicated: Secondary | ICD-10-CM | POA: Insufficient documentation

## 2018-04-26 DIAGNOSIS — Y939 Activity, unspecified: Secondary | ICD-10-CM | POA: Insufficient documentation

## 2018-04-26 DIAGNOSIS — Y999 Unspecified external cause status: Secondary | ICD-10-CM | POA: Insufficient documentation

## 2018-04-26 MED ORDER — TETANUS-DIPHTH-ACELL PERTUSSIS 5-2.5-18.5 LF-MCG/0.5 IM SUSP
INTRAMUSCULAR | Status: AC
  Filled 2018-04-26: qty 0.5

## 2018-04-26 MED ORDER — TETANUS-DIPHTH-ACELL PERTUSSIS 5-2.5-18.5 LF-MCG/0.5 IM SUSP
0.5000 mL | Freq: Once | INTRAMUSCULAR | Status: AC
  Administered 2018-04-26: 0.5 mL via INTRAMUSCULAR
  Filled 2018-04-26: qty 0.5
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
## 2018-04-26 NOTE — ED Notes (Signed)
Pt refused application of dressing
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
## 2019-06-01 ENCOUNTER — Other Ambulatory Visit: Payer: Self-pay

## 2019-06-01 ENCOUNTER — Encounter (HOSPITAL_COMMUNITY): Payer: Self-pay | Admitting: Emergency Medicine

## 2019-06-01 ENCOUNTER — Emergency Department (HOSPITAL_COMMUNITY)
Admission: EM | Admit: 2019-06-01 | Discharge: 2019-06-01 | Disposition: A | Discharge status: Home / Self Care | Payer: BC Managed Care – PPO | Attending: Emergency Medicine | Admitting: Emergency Medicine

## 2019-06-01 DIAGNOSIS — H5789 Other specified disorders of eye and adnexa: Secondary | ICD-10-CM | POA: Diagnosis present

## 2019-06-01 DIAGNOSIS — F17210 Nicotine dependence, cigarettes, uncomplicated: Secondary | ICD-10-CM | POA: Diagnosis not present

## 2019-06-01 DIAGNOSIS — H1033 Unspecified acute conjunctivitis, bilateral: Secondary | ICD-10-CM | POA: Insufficient documentation

## 2019-06-01 MED ORDER — NAPHAZOLINE-PHENIRAMINE 0.025-0.3 % OP SOLN: 1.0000 [drp] | Freq: Four times a day (QID) | OPHTHALMIC | 0 refills | Status: AC | PRN

## 2019-06-01 MED ORDER — ERYTHROMYCIN 5 MG/GM OP OINT: TOPICAL_OINTMENT | OPHTHALMIC | 0 refills | Status: AC

## 2019-06-01 NOTE — ED Provider Notes (Signed)
[facility] EMERGENCY DEPARTMENT Provider Note   CSN: [id number] Arrival date & time: 06/01/19  3295     History Chief Complaint  Patient presents with  . Eye trouble    Hayden Anderson is a 54 y.o. male.   Patient with a complaint of bilateral eye irritation right now right eye worse than left.  Has had sort of a greenish discharge of the left eye and its been crusted in the morning.  Is been happening on and off over the past few weeks.  Patient's tried several over-the-counter eyedrops.  No significant eye pain just irritation.  No known history of allergies no sneezing.  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  But has not had trouble in the past.        Past Medical History:  Diagnosis Date  . Low back pain     There are no problems to display for this patient.   History reviewed. No pertinent surgical history.     Family History  Problem Relation Age of Onset  . Hypertension Mother   . Hypertension Brother   . Hypertension Other     Social History   Tobacco Use  . Smoking status: Current Every Day Smoker    Packs/day: 1.00    Years: 25.00    Pack years: 25.00    Types: Cigarettes    Last attempt to quit: 05/06/2013    Years since quitting: 6.0  . Smokeless tobacco: Never Used  Substance Use Topics  . Alcohol use: Yes    Comment: weekends  . Drug use: No    Home Medications Prior to Admission medications   Medication Sig Start Date End Date Taking? Authorizing Provider  erythromycin ophthalmic ointment Place a 1/2 inch ribbon of ointment into the lower eyelid of both eyes 3 times a day for 1 week. 06/01/19   Vanetta Mulders, MD  ibuprofen (ADVIL,MOTRIN) 200 MG tablet Take 800 mg by mouth every 6 (six) hours as needed for moderate pain.    [provider]  lidocaine (LIDODERM) 5 % Place 1 patch onto the skin daily. Remove & Discard patch within 12 hours or as directed by MD 03/27/18   Bill Salinas,  PA-C  naphazoline-pheniramine (NAPHCON-A) 0.025-0.3 % ophthalmic solution Place 1 drop into both eyes 4 (four) times daily as needed for eye irritation or allergies. 06/01/19   Vanetta Mulders, MD    Allergies    Patient has no known allergies.  Review of Systems   Review of Systems  Constitutional: Negative for chills and fever.  HENT: Negative for congestion, rhinorrhea and sore throat.   Eyes: Positive for discharge, redness and itching. Negative for visual disturbance.  Respiratory: Negative for cough and shortness of breath.   Cardiovascular: Negative for chest pain and leg swelling.  Gastrointestinal: Negative for abdominal pain, diarrhea, nausea and vomiting.  Genitourinary: Negative for dysuria.  Musculoskeletal: Negative for back pain and neck pain.  Skin: Negative for rash.  Neurological: Negative for dizziness, light-headedness and headaches.  Hematological: Does not bruise/bleed easily.  Psychiatric/Behavioral: Negative for confusion.    Physical Exam Updated Vital Signs BP 135/83   Pulse 67   Temp 97.8 F (36.6 C) (Oral)   Resp 16   Ht 1.88 m (6\' 2" )   Wt 81.6 kg   SpO2 98%   BMI 23.11 kg/m   Physical Exam Vitals and nursing note reviewed.  Constitutional:      General:  He is not in acute distress.    Appearance: Normal appearance. He is well-developed.  HENT:     Head: Normocephalic and atraumatic.  Eyes:     General:        Right eye: Discharge present.        Left eye: No discharge.     Extraocular Movements: Extraocular movements intact.     Pupils: Pupils are equal, round, and reactive to light.     Comments: Bilateral conjunctival irritation erythema.  Right eye with some discharge.  Anterior chamber normal.  Cornea without evidence of any abnormalities.  Cardiovascular:     Rate and Rhythm: Normal rate and regular rhythm.     Heart sounds: No murmur.  Pulmonary:     Effort: Pulmonary effort is normal. No respiratory distress.     Breath  sounds: Normal breath sounds.  Abdominal:     Palpations: Abdomen is soft.     Tenderness: There is no abdominal tenderness.  Musculoskeletal:     Cervical back: Normal range of motion and neck supple.  Skin:    General: Skin is warm and dry.  Neurological:     General: No focal deficit present.     Mental Status: He is alert and oriented to person, place, and time.     ED Results / Procedures / Treatments   Labs (all labs ordered are listed, but only abnormal results are displayed) Labs Reviewed - No data to display  EKG None  Radiology No results found.  Procedures Procedures (including critical care time)  Medications Ordered in ED Medications - No data to display  ED Course  I have reviewed the triage vital signs and the nursing notes.  Pertinent labs & imaging results that were available during my care of the patient were reviewed by me and considered in my medical decision making (see chart for details).    MDM Rules/Calculators/A&P                     Symptoms seem to be consistent with conjunctivitis.  Could be allergic could be irritant.  I will treat with erythromycin ointment in both eyes.  And a antihistamine eyedrop in case allergic in nature.  Patient will return for any new or worse symptoms.  Patient states he does not need work note.    Final Clinical Impression(s) / ED Diagnoses Final diagnoses:  Acute conjunctivitis of both eyes, unspecified acute conjunctivitis type    Rx / DC Orders ED Discharge Orders         Ordered    erythromycin ophthalmic ointment     06/01/19 0735    naphazoline-pheniramine (NAPHCON-A) 0.025-0.3 % ophthalmic solution  4 times daily PRN     06/01/19 0735           Fredia Sorrow, MD 06/01/19 [phone]

## 2019-06-01 NOTE — ED Triage Notes (Signed)
Pt thinks he has an infection in both eyes. States they have been draining and have a greenish discharge. Has been treating with OTC eye drops as well as medicated drops for suspicion of pink eye but states he thinks his job might be causing issue.

## 2019-06-01 NOTE — Discharge Instructions (Addendum)
Use the erythromycin ointment 3 times a day in both eyes.  Allow that to kind of melted and then use the antihistamine eyedrops that have been prescribed.  Use them every 6 hours.  Return for any new or worse symptoms.

## 2019-12-19 ENCOUNTER — Other Ambulatory Visit: Payer: Self-pay

## 2019-12-19 ENCOUNTER — Encounter (HOSPITAL_COMMUNITY): Payer: Self-pay

## 2019-12-19 ENCOUNTER — Emergency Department (HOSPITAL_COMMUNITY)
Admission: EM | Admit: 2019-12-19 | Discharge: 2019-12-19 | Disposition: A | Discharge status: Home / Self Care | Payer: BC Managed Care – PPO | Attending: Emergency Medicine | Admitting: Emergency Medicine

## 2019-12-19 DIAGNOSIS — K4090 Unilateral inguinal hernia, without obstruction or gangrene, not specified as recurrent: Secondary | ICD-10-CM | POA: Insufficient documentation

## 2019-12-19 DIAGNOSIS — X500XXA Overexertion from strenuous movement or load, initial encounter: Secondary | ICD-10-CM | POA: Diagnosis not present

## 2019-12-19 DIAGNOSIS — R1030 Lower abdominal pain, unspecified: Secondary | ICD-10-CM | POA: Diagnosis present

## 2019-12-19 DIAGNOSIS — F17210 Nicotine dependence, cigarettes, uncomplicated: Secondary | ICD-10-CM | POA: Diagnosis not present

## 2019-12-19 DIAGNOSIS — Y990 Civilian activity done for income or pay: Secondary | ICD-10-CM | POA: Diagnosis not present

## 2019-12-19 MED ORDER — NAPROXEN 500 MG PO TABS: ORAL_TABLET | ORAL | 1 refills | Status: DC

## 2019-12-19 NOTE — ED Triage Notes (Signed)
Pt presents to ED with complaints of right sided groin pain since Saturday.

## 2019-12-19 NOTE — ED Provider Notes (Signed)
[facility] EMERGENCY DEPARTMENT Provider Note   CSN: [id number] Arrival date & time: 12/19/19  2505     History Chief Complaint  Patient presents with  . Groin Pain    Hayden Anderson is a 54 y.o. male.  Patient injured his groin while he was lifting at work.  Patient continues to have pain with lifting but not just at rest  The history is provided by the patient and medical records. No language interpreter was used.  Groin Pain This is a new problem. The current episode started 12 to 24 hours ago. The problem occurs constantly. The problem has not changed since onset.Associated symptoms include abdominal pain. Pertinent negatives include no chest pain and no headaches. Nothing aggravates the symptoms. Nothing relieves the symptoms. The treatment provided no relief.       Past Medical History:  Diagnosis Date  . Low back pain     There are no problems to display for this patient.   History reviewed. No pertinent surgical history.     Family History  Problem Relation Age of Onset  . Hypertension Mother   . Hypertension Brother   . Hypertension Other     Social History   Tobacco Use  . Smoking status: Current Every Day Smoker    Packs/day: 1.00    Years: 25.00    Pack years: 25.00    Types: Cigarettes    Last attempt to quit: 05/06/2013    Years since quitting: 6.6  . Smokeless tobacco: Never Used  Vaping Use  . Vaping Use: Never assessed  Substance Use Topics  . Alcohol use: Yes    Comment: weekends  . Drug use: No    Home Medications Prior to Admission medications   Medication Sig Start Date End Date Taking? Authorizing Provider  erythromycin ophthalmic ointment Place a 1/2 inch ribbon of ointment into the lower eyelid of both eyes 3 times a day for 1 week. 06/01/19   Vanetta Mulders, MD  ibuprofen (ADVIL,MOTRIN) 200 MG tablet Take 800 mg by mouth every 6 (six) hours as needed for moderate pain.    [provider]  lidocaine (LIDODERM) 5 %  Place 1 patch onto the skin daily. Remove & Discard patch within 12 hours or as directed by MD 03/27/18   Bill Salinas, PA-C  naphazoline-pheniramine (NAPHCON-A) 0.025-0.3 % ophthalmic solution Place 1 drop into both eyes 4 (four) times daily as needed for eye irritation or allergies. 06/01/19   Vanetta Mulders, MD  naproxen (NAPROSYN) 500 MG tablet Bid prn pain 12/19/19   Bethann Berkshire, MD    Allergies    Patient has no known allergies.  Review of Systems   Review of Systems  Constitutional: Negative for appetite change and fatigue.  HENT: Negative for congestion, ear discharge and sinus pressure.   Eyes: Negative for discharge.  Respiratory: Negative for cough.   Cardiovascular: Negative for chest pain.  Gastrointestinal: Positive for abdominal pain. Negative for diarrhea.  Genitourinary: Negative for frequency and hematuria.  Musculoskeletal: Negative for back pain.  Skin: Negative for rash.  Neurological: Negative for seizures and headaches.  Psychiatric/Behavioral: Negative for hallucinations.    Physical Exam Updated Vital Signs BP (!) 133/91 (BP Location: Right Arm)   Pulse 65   Temp 98 F (36.7 C) (Oral)   Resp 18   Ht 6\' 2"  (1.88 m)   Wt 86.2 kg   SpO2 100%   BMI 24.39 kg/m   Physical Exam Vitals reviewed.  Constitutional:  Appearance: He is well-developed.  HENT:     Head: Normocephalic.     Nose: Nose normal.  Eyes:     General: No scleral icterus.    Conjunctiva/sclera: Conjunctivae normal.  Neck:     Thyroid: No thyromegaly.  Cardiovascular:     Rate and Rhythm: Normal rate and regular rhythm.     Heart sounds: No murmur heard.  No friction rub. No gallop.   Pulmonary:     Breath sounds: No stridor. No wheezing or rales.  Chest:     Chest wall: No tenderness.  Abdominal:     General: There is no distension.     Tenderness: There is no abdominal tenderness. There is no rebound.  Genitourinary:    Comments: Patient has an inguinal  hernia.  No evidence of incarceration Musculoskeletal:        General: Normal range of motion.     Cervical back: Neck supple.  Lymphadenopathy:     Cervical: No cervical adenopathy.  Skin:    Findings: No erythema or rash.  Neurological:     Mental Status: He is alert and oriented to person, place, and time.     Motor: No abnormal muscle tone.     Coordination: Coordination normal.  Psychiatric:        Behavior: Behavior normal.     ED Results / Procedures / Treatments   Labs (all labs ordered are listed, but only abnormal results are displayed) Labs Reviewed - No data to display  EKG None  Radiology No results found.  Procedures Procedures (including critical care time)  Medications Ordered in ED Medications - No data to display  ED Course  I have reviewed the triage vital signs and the nursing notes.  Pertinent labs & imaging results that were available during my care of the patient were reviewed by me and considered in my medical decision making (see chart for details).    MDM Rules/Calculators/A&P                          Patient with right inguinal hernia.  He will be referred to general surgery and given our concern for pain and he will not do any lifting over 10 pounds Final Clinical Impression(s) / ED Diagnoses Final diagnoses:  Right inguinal hernia    Rx / DC Orders ED Discharge Orders         Ordered    naproxen (NAPROSYN) 500 MG tablet        12/19/19 2633           Bethann Berkshire, MD 12/19/19 [phone]

## 2019-12-19 NOTE — Discharge Instructions (Signed)
Follow-up with Dr. Lovell Sheehan.  Call for an appointment.  Do no lifting over 10 pounds until you see him.  Get an appointment within the next week return if much worse

## 2020-02-08 ENCOUNTER — Other Ambulatory Visit: Payer: Self-pay

## 2020-02-08 ENCOUNTER — Ambulatory Visit: Payer: BC Managed Care – PPO

## 2020-02-08 ENCOUNTER — Ambulatory Visit (INDEPENDENT_AMBULATORY_CARE_PROVIDER_SITE_OTHER): Payer: BC Managed Care – PPO | Admitting: Orthopaedic Surgery

## 2020-02-08 ENCOUNTER — Encounter: Payer: Self-pay | Admitting: Orthopaedic Surgery

## 2020-02-08 VITALS — BP 139/85 | HR 68 | Ht 74.0 in | Wt 200.5 lb

## 2020-02-08 DIAGNOSIS — M25562 Pain in left knee: Secondary | ICD-10-CM

## 2020-02-08 DIAGNOSIS — G8929 Other chronic pain: Secondary | ICD-10-CM

## 2020-02-08 DIAGNOSIS — M25561 Pain in right knee: Secondary | ICD-10-CM

## 2020-02-08 DIAGNOSIS — F17210 Nicotine dependence, cigarettes, uncomplicated: Secondary | ICD-10-CM

## 2020-02-08 MED ORDER — NAPROXEN 500 MG PO TABS: 500.0000 mg | ORAL_TABLET | Freq: Two times a day (BID) | ORAL | 5 refills | Status: AC

## 2020-02-08 NOTE — Patient Instructions (Signed)

## 2020-02-08 NOTE — Progress Notes (Signed)
Subjective:    Patient ID: Hayden Anderson, male    DOB: [birth year], 55 y.o.   MRN: [id number]  HPI He has pain of both knees, more on the right.  They have been getting worse over the last few months.  The cold weather makes it worse.  He has some giving way at times of the right knee.  He does not take any medicine for this, rarely an Advil.  He has no trauma, no redness.  He has swelling at times.  He is tired of the pain. I saw him for left knee pain about 10 years ago.   Review of Systems  Constitutional: Positive for activity change.  Musculoskeletal: Positive for back pain, gait problem and joint swelling.  All other systems reviewed and are negative.  For Review of Systems, all other systems reviewed and are negative.  The following is a summary of the past history medically, past history surgically, known current medicines, social history and family history.  This information is gathered electronically by the computer from prior information and documentation.  I review this each visit and have found including this information at this point in the chart is beneficial and informative.   Past Medical History:  Diagnosis Date  . Low back pain     No past surgical history on file.  Current Outpatient Medications on File Prior to Visit  Medication Sig Dispense Refill  . erythromycin ophthalmic ointment Place a 1/2 inch ribbon of ointment into the lower eyelid of both eyes 3 times a day for 1 week. 1 g 0  . ibuprofen (ADVIL,MOTRIN) 200 MG tablet Take 800 mg by mouth every 6 (six) hours as needed for moderate pain.    Marland Kitchen lidocaine (LIDODERM) 5 % Place 1 patch onto the skin daily. Remove & Discard patch within 12 hours or as directed by MD 10 patch 0  . naphazoline-pheniramine (NAPHCON-A) 0.025-0.3 % ophthalmic solution Place 1 drop into both eyes 4 (four) times daily as needed for eye irritation or allergies. 15 mL 0  . naproxen (NAPROSYN) 500 MG tablet Bid prn pain 30 tablet 1   No  current facility-administered medications on file prior to visit.    Social History   Socioeconomic History  . Marital status: Single    Spouse name: Not on file  . Number of children: Not on file  . Years of education: Not on file  . Highest education level: Not on file  Occupational History  . Not on file  Tobacco Use  . Smoking status: Current Every Day Smoker    Packs/day: 1.00    Years: 25.00    Pack years: 25.00    Types: Cigarettes    Last attempt to quit: 05/06/2013    Years since quitting: 6.7  . Smokeless tobacco: Never Used  Vaping Use  . Vaping Use: Not on file  Substance and Sexual Activity  . Alcohol use: Yes    Comment: weekends  . Drug use: No  . Sexual activity: Yes  Other Topics Concern  . Not on file  Social History Narrative  . Not on file   Social Determinants of Health   Financial Resource Strain: Not on file  Food Insecurity: Not on file  Transportation Needs: Not on file  Physical Activity: Not on file  Stress: Not on file  Social Connections: Not on file  Intimate Partner Violence: Not on file    Family History  Problem Relation Age of Onset  .  Hypertension Mother   . Hypertension Brother   . Hypertension Other     BP 139/85   Pulse 68   Ht 6\' 2"  (1.88 m)   Wt 200 lb 8 oz (90.9 kg)   BMI 25.74 kg/m   Body mass index is 25.74 kg/m.      Objective:   Physical Exam Vitals and nursing note reviewed. Exam conducted with a chaperone present.  Constitutional:      Appearance: He is well-developed and well-nourished.  HENT:     Head: Normocephalic and atraumatic.  Eyes:     Extraocular Movements: EOM normal.     Conjunctiva/sclera: Conjunctivae normal.     Pupils: Pupils are equal, round, and reactive to light.  Cardiovascular:     Rate and Rhythm: Normal rate and regular rhythm.     Pulses: Intact distal pulses.  Pulmonary:     Effort: Pulmonary effort is normal.  Abdominal:     Palpations: Abdomen is soft.   Musculoskeletal:     Cervical back: Normal range of motion and neck supple.       Legs:  Skin:    General: Skin is warm and dry.  Neurological:     Mental Status: He is alert and oriented to person, place, and time.     Cranial Nerves: No cranial nerve deficit.     Motor: No abnormal muscle tone.     Coordination: Coordination normal.     Deep Tendon Reflexes: Reflexes are normal and symmetric. Reflexes normal.  Psychiatric:        Mood and Affect: Mood and affect normal.        Behavior: Behavior normal.        Thought Content: Thought content normal.        Judgment: Judgment normal.    X-rays were done of both knees, reported separately.      Assessment & Plan:   Encounter Diagnoses  Name Primary?  . Chronic pain of right knee Yes  . Chronic pain of left knee    I will begin Naprosyn 500 po bid pc.  Return in two weeks.  Consider MRI if not improved.  Call if any problem.  Precautions discussed.   Electronically Signed , MD 1/27/20229:24 AM

## 2020-03-04 ENCOUNTER — Emergency Department (HOSPITAL_COMMUNITY)
Admission: EM | Admit: 2020-03-04 | Discharge: 2020-03-04 | Disposition: A | Discharge status: Home / Self Care | Payer: BC Managed Care – PPO | Attending: Emergency Medicine | Admitting: Emergency Medicine

## 2020-03-04 ENCOUNTER — Other Ambulatory Visit: Payer: Self-pay

## 2020-03-04 ENCOUNTER — Encounter (HOSPITAL_COMMUNITY): Payer: Self-pay | Admitting: Emergency Medicine

## 2020-03-04 ENCOUNTER — Emergency Department (HOSPITAL_COMMUNITY): Payer: BC Managed Care – PPO

## 2020-03-04 DIAGNOSIS — F17210 Nicotine dependence, cigarettes, uncomplicated: Secondary | ICD-10-CM | POA: Diagnosis not present

## 2020-03-04 DIAGNOSIS — R1031 Right lower quadrant pain: Secondary | ICD-10-CM

## 2020-03-04 DIAGNOSIS — R1030 Lower abdominal pain, unspecified: Secondary | ICD-10-CM | POA: Diagnosis present

## 2020-03-04 DIAGNOSIS — N50811 Right testicular pain: Secondary | ICD-10-CM | POA: Insufficient documentation

## 2020-03-04 LAB — URINALYSIS, ROUTINE W REFLEX MICROSCOPIC
Bilirubin Urine: NEGATIVE
Glucose, UA: NEGATIVE mg/dL
Hgb urine dipstick: NEGATIVE
Ketones, ur: NEGATIVE mg/dL
Leukocytes,Ua: NEGATIVE
Nitrite: NEGATIVE
Protein, ur: NEGATIVE mg/dL
Specific Gravity, Urine: 1.02 (ref 1.005–1.030)
pH: 5 (ref 5.0–8.0)

## 2020-03-04 MED ORDER — IBUPROFEN 800 MG PO TABS: 800.0000 mg | ORAL_TABLET | Freq: Three times a day (TID) | ORAL | 0 refills | Status: DC | PRN

## 2020-03-04 MED ORDER — DICLOFENAC SODIUM 1 % EX GEL: 2.0000 g | Freq: Four times a day (QID) | CUTANEOUS | 0 refills | Status: AC | PRN

## 2020-03-04 NOTE — ED Provider Notes (Signed)
Emergency Department Provider Note   I have reviewed the triage vital signs and the nursing notes.   HISTORY  Chief Complaint Groin Pain   HPI Hayden Anderson is a 55 y.o. male with past medical history reviewed below presents to the emergency department with continued pain in the groin area.  Patient has a job with frequent lifting which does seem to make the pain worse.  He has not noticed any sudden severe pain while lifting or bulging in his groin area.  The pain does radiate down into the right testicle.  He states he was seen at urgent care after initial ED evaluation and started on Levaquin with concern for infection but states no urine tests or other imaging were done.  He states it did seem to make it somewhat better but then pain has returned.  He is not having fevers or chills.  No dysuria, hesitancy, urgency.  No pain into the lower abdomen or flank.  Pain is moderate and persistent.  Pain not responding to over-the-counter pain medications.  Past Medical History:  Diagnosis Date  . Low back pain     There are no problems to display for this patient.   History reviewed. No pertinent surgical history.  Allergies Patient has no known allergies.  Family History  Problem Relation Age of Onset  . Hypertension Mother   . Hypertension Brother   . Hypertension Other     Social History Social History   Tobacco Use  . Smoking status: Current Every Day Smoker    Packs/day: 1.00    Years: 25.00    Pack years: 25.00    Types: Cigarettes    Last attempt to quit: 05/06/2013    Years since quitting: 6.8  . Smokeless tobacco: Never Used  Substance Use Topics  . Alcohol use: Yes    Comment: weekends  . Drug use: No    Review of Systems  Constitutional: No fever/chills Eyes: No visual changes. ENT: No sore throat. Cardiovascular: Denies chest pain. Respiratory: Denies shortness of breath. Gastrointestinal: No abdominal pain.  No nausea, no vomiting.  No  diarrhea.  No constipation. Genitourinary: Negative for dysuria. Right groin and testicle pain.  Musculoskeletal: Negative for back pain. Skin: Negative for rash. Neurological: Negative for headaches, focal weakness or numbness.  10-point ROS otherwise negative.  ____________________________________________   PHYSICAL EXAM:  VITAL SIGNS: ED Triage Vitals  Enc Vitals Group     BP 03/04/20 0751 132/90     Pulse Rate 03/04/20 0751 64     Resp 03/04/20 0751 18     Temp 03/04/20 0751 97.8 F (36.6 C)     Temp src --      SpO2 03/04/20 0751 100 %     Weight 03/04/20 0754 200 lb (90.7 kg)     Height 03/04/20 0754 6\' 2"  (1.88 m)   Constitutional: Alert and oriented. Well appearing and in no acute distress. Eyes: Conjunctivae are normal. Head: Atraumatic. Nose: No congestion/rhinnorhea. Mouth/Throat: Mucous membranes are moist.   Neck: No stridor.   Cardiovascular: Normal rate, regular rhythm. Good peripheral circulation. Grossly normal heart sounds.   Respiratory: Normal respiratory effort.  No retractions. Lungs CTAB. Gastrointestinal: Soft and nontender. No distention.  Genitourinary: Exam performed with the patient's verbal consent.  I do not palpate any masses in the inguinal crease.  The right testicle is normal size and lie.  There is some mild tenderness posteriorly.  No scrotal cellulitis or swelling.  Musculoskeletal:  No  gross deformities of extremities. Neurologic:  Normal speech and language.  Skin:  Skin is warm, dry and intact. No rash noted.  ____________________________________________   LABS (all labs ordered are listed, but only abnormal results are displayed)  Labs Reviewed  URINE CULTURE  URINALYSIS, ROUTINE W REFLEX MICROSCOPIC   ____________________________________________  RADIOLOGY  CT Renal Stone Study  Result Date: 03/04/2020 CLINICAL DATA:  Right groin pain for 2 weeks. EXAM: CT ABDOMEN AND PELVIS WITHOUT CONTRAST TECHNIQUE: Multidetector CT  imaging of the abdomen and pelvis was performed following the standard protocol without IV contrast. COMPARISON:  08/24/2009 CT abdomen/pelvis. FINDINGS: Lower chest: No significant pulmonary nodules or acute consolidative airspace disease. Hepatobiliary: Normal liver size. No liver mass. Normal gallbladder with no radiopaque cholelithiasis. No biliary ductal dilatation. Pancreas: Normal, with no mass or duct dilation. Spleen: Normal size. No mass. Adrenals/Urinary Tract: Normal adrenals. Several simple renal cysts scattered in both kidneys, largest 5.1 cm in the lower left kidney. Punctate nonobstructing 1 mm lower right renal stone. No additional renal stones. No hydronephrosis. Normal caliber ureters. No ureteral stones. Normal bladder with no bladder stones. Stomach/Bowel: Normal non-distended stomach. Normal caliber small bowel with no small bowel wall thickening. Normal appendix. Moderate diffuse colonic diverticulosis with no large bowel wall thickening or significant pericolonic fat stranding. Vascular/Lymphatic: Atherosclerotic nonaneurysmal abdominal aorta. No pathologically enlarged lymph nodes in the abdomen or pelvis. Reproductive: Normal size prostate. Other: No pneumoperitoneum, ascites or focal fluid collection. No evidence of inguinal hernia. Musculoskeletal: No aggressive appearing focal osseous lesions. IMPRESSION: 1. No acute abnormality. Punctate nonobstructing lower right renal stone. No hydronephrosis. No ureteral or bladder stones. 2. Moderate diffuse colonic diverticulosis. 3. Aortic Atherosclerosis (ICD10-I70.0). Electronically Signed   By: Delbert Phenix M.D.   On: 03/04/2020 11:07   US SCROTUM W/DOPPLER  Result Date: 03/04/2020 CLINICAL DATA:  Acute right testicular pain. EXAM: SCROTAL ULTRASOUND DOPPLER ULTRASOUND OF THE TESTICLES TECHNIQUE: Complete ultrasound examination of the testicles, epididymis, and other scrotal structures was performed. Color and spectral Doppler ultrasound  were also utilized to evaluate blood flow to the testicles. COMPARISON:  None. FINDINGS: Right testicle Measurements: 4.8 x 3.8 x 2.1 cm. No mass or microlithiasis visualized. Left testicle Measurements: 4.7 x 3.3 x 1.9 cm. No microlithiasis visualized. Complex cystic abnormality measuring 1 cm seen in the inferior portion of the left testicle which may represent tubular ectasia of the rete testes. Right epididymis:  Normal in size and appearance. Left epididymis:  Normal in size and appearance. Hydrocele:  None visualized. Varicocele:  Moderate left varicocele is noted. Pulsed Doppler interrogation of both testes demonstrates normal low resistance arterial and venous waveforms bilaterally. IMPRESSION: No definite evidence of testicular mass or torsion. Probable focus of tubular ectasia of the rete testes is seen in the inferior portion of the left testicle. Moderate left varicocele is noted. Electronically Signed   By: Lupita Raider M.D.   On: 03/04/2020 09:28    ____________________________________________   PROCEDURES  Procedure(s) performed:   Procedures  None  ____________________________________________   INITIAL IMPRESSION / ASSESSMENT AND PLAN / ED COURSE  Pertinent labs & imaging results that were available during my care of the patient were reviewed by me and considered in my medical decision making (see chart for details).   Patient presents the emergency department for evaluation of right side groin pain to the testicle.  Differential includes muscle strain, hernia, epididymitis/orchitis, with much lower suspicion for testicular torsion.  Plan for UA here.  Patient not having any  urethral discharge.  No palpable hernia on exam.  Plan for scrotal ultrasound and reassess.   [place] negative for acute process. Some varicocele noted but on the left. No pain in this area. No epididymitis changes on ultrasound.  No torsion.  No findings to suspect abscess orchitis.  Plan for CT to assess for  possible hernia vs kidney stone.   CT abdomen/pelvis is negative for acute process. UA negative for infection. Possible muscle pull causing symptoms. Plan for medication for pain mgmt and discussed proper lift technique. Plan for discharge with close PCP follow up.  ____________________________________________  FINAL CLINICAL IMPRESSION(S) / ED DIAGNOSES  Final diagnoses:  Right inguinal pain  Right testicular pain    NEW OUTPATIENT MEDICATIONS STARTED DURING THIS VISIT:  Discharge Medication List as of 03/04/2020 11:41 AM    START taking these medications   Details  diclofenac Sodium (VOLTAREN) 1 % GEL Apply 2 g topically 4 (four) times daily as needed., Starting Mon 03/04/2020, Normal        Note:  This document was prepared using Dragon voice recognition software and may include unintentional dictation errors.  Alona Bene, MD, [facility] Emergency Medicine    Lilianah Buffin, Arlyss Repress, MD 03/04/20 [phone]

## 2020-03-04 NOTE — ED Triage Notes (Signed)
Pt c/o of right groin pain x 2 weeks. Lifting heavy items at work and states its worse when he is working. Denies any recent injury

## 2020-03-04 NOTE — Discharge Instructions (Signed)

## 2020-03-05 LAB — URINE CULTURE: Culture: NO GROWTH
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
## 2023-09-30 ENCOUNTER — Emergency Department (HOSPITAL_COMMUNITY)
Admission: EM | Admit: 2023-09-30 | Discharge: 2023-09-30 | Disposition: A | Discharge status: Home / Self Care | Attending: Emergency Medicine | Admitting: Emergency Medicine

## 2023-09-30 ENCOUNTER — Encounter (HOSPITAL_COMMUNITY): Payer: Self-pay

## 2023-09-30 ENCOUNTER — Other Ambulatory Visit: Payer: Self-pay

## 2023-09-30 DIAGNOSIS — S39012A Strain of muscle, fascia and tendon of lower back, initial encounter: Secondary | ICD-10-CM | POA: Insufficient documentation

## 2023-09-30 DIAGNOSIS — Y990 Civilian activity done for income or pay: Secondary | ICD-10-CM | POA: Insufficient documentation

## 2023-09-30 DIAGNOSIS — X58XXXA Exposure to other specified factors, initial encounter: Secondary | ICD-10-CM | POA: Diagnosis not present

## 2023-09-30 DIAGNOSIS — S3992XA Unspecified injury of lower back, initial encounter: Secondary | ICD-10-CM | POA: Diagnosis present

## 2023-09-30 MED ORDER — METHOCARBAMOL 500 MG PO TABS
500.0000 mg | ORAL_TABLET | Freq: Once | ORAL | Status: AC
  Administered 2023-09-30: 500 mg via ORAL
  Filled 2023-09-30: qty 1

## 2023-09-30 MED ORDER — IBUPROFEN 400 MG PO TABS: 400.0000 mg | ORAL_TABLET | Freq: Four times a day (QID) | ORAL | 0 refills | Status: AC | PRN

## 2023-09-30 MED ORDER — METHOCARBAMOL 500 MG PO TABS: 500.0000 mg | ORAL_TABLET | Freq: Two times a day (BID) | ORAL | 0 refills | Status: AC

## 2023-09-30 MED ORDER — KETOROLAC TROMETHAMINE 15 MG/ML IJ SOLN
15.0000 mg | Freq: Once | INTRAMUSCULAR | Status: AC
  Administered 2023-09-30: 15 mg via INTRAMUSCULAR
  Filled 2023-09-30: qty 1

## 2023-09-30 NOTE — Discharge Instructions (Signed)
 It was a pleasure taking care of you today. Your workup was reassuring. I've provided you with instructions on how to care for your low back pain. You are also receiving a prescription for a muscle relaxer to take as needed for pain. This medication can make you sleepy so do not take this while operating heavy machinery. You can also continue taking ibuprofen  as needed for pain. If your symptoms worsen, you lose control of your bladder or bowel function, or you have sudden severe weight loss, please return to the ED for further evaluation.

## 2023-09-30 NOTE — ED Triage Notes (Signed)
 Pt arrived via [facility] c/o left side back pain, shoulder pain and reports his bones are hurt. Pt reports pain began while at work. Pt reports trying Goody Powder w/o relief. Pt denies injury.

## 2023-09-30 NOTE — ED Notes (Signed)
 Pt did not want another set of vitals

## 2023-09-30 NOTE — ED Provider Notes (Signed)
 [place] EMERGENCY DEPARTMENT AT [facility] Provider Note   CSN: [id number] Arrival date & time: 09/30/23  1306     Patient presents with: Back Pain   Hayden Anderson is a 58 y.o. male with no PMHX who presents to the ED with a compplaint of back pain that started 3 days ago. Patient was at work when he noticed the pain, but has not engaged in any strenuous activity. He has been taking Goody powder with some relief. He reports the pain is worse with movement and activity and improves at rest.   Back Pain      Prior to Admission medications   Medication Sig Start Date End Date Taking? Authorizing Provider  ibuprofen  (ADVIL ) 400 MG tablet Take 1 tablet (400 mg total) by mouth every 6 (six) hours as needed. 09/30/23  Yes Summit Arroyave, Marry RAMAN, PA-C  methocarbamol  (ROBAXIN ) 500 MG tablet Take 1 tablet (500 mg total) by mouth 2 (two) times daily. 09/30/23  Yes Meilah Delrosario, Marry RAMAN, PA-C  diclofenac  Sodium (VOLTAREN ) 1 % GEL Apply 2 g topically 4 (four) times daily as needed. 03/04/20   Long, Joshua G, MD  erythromycin  ophthalmic ointment Place a 1/2 inch ribbon of ointment into the lower eyelid of both eyes 3 times a day for 1 week. 06/01/19   Zackowski, Scott, MD  lidocaine  (LIDODERM ) 5 % Place 1 patch onto the skin daily. Remove & Discard patch within 12 hours or as directed by MD 03/27/18   Donah Penne LABOR, PA-C  naphazoline-pheniramine (NAPHCON-A) 0.025-0.3 % ophthalmic solution Place 1 drop into both eyes 4 (four) times daily as needed for eye irritation or allergies. 06/01/19   Zackowski, Scott, MD  naproxen  (NAPROSYN ) 500 MG tablet Take 1 tablet (500 mg total) by mouth 2 (two) times daily with a meal. 02/08/20   Brenna Lin, MD    Allergies: Patient has no known allergies.    Review of Systems  Musculoskeletal:  Positive for back pain.    Updated Vital Signs BP (!) 150/94 (BP Location: Right Arm)   Pulse 70   Temp 98 F (36.7 C) (Temporal)   Resp 18   Ht 6' 2  (1.88 m)   Wt 90.7 kg   SpO2 100%   BMI 25.67 kg/m   Physical Exam Vitals and nursing note reviewed.  Constitutional:      Appearance: Normal appearance. He is not ill-appearing.  Eyes:     General: No scleral icterus. Pulmonary:     Effort: Pulmonary effort is normal. No respiratory distress.  Musculoskeletal:        General: No deformity.     Comments:  Motor (Lower Extremities): Bulk and tone normal. Strength 5/5 throughout bilateral hip flexors .  Range of Motion (Lower Extremities): Full active and passive range of motion at hips, knees, and ankles bilaterally without pain or restriction.  Reflexes: Patellar and Achilles reflexes 2+ and symmetric bilaterally. No clonus or pathologic reflexes.  Gait and Station: Gait is steady and symmetric. Able to rise from a chair without use of hands, walk with normal stride length and arm swing. No assistive device required. No evidence of ataxia, shuffling, or abnormal posturing.  Impression: Normal neurological examination with normal lower extremity strength, range of motion, and gait.  Skin:    Coloration: Skin is not jaundiced.  Neurological:     General: No focal deficit present.     Mental Status: He is alert.     Motor: No weakness.  Psychiatric:  Mood and Affect: Mood normal.      Medications Ordered in the ED  ketorolac  (TORADOL ) 15 MG/ML injection 15 mg (15 mg Intramuscular Given 09/30/23 1452)  methocarbamol  (ROBAXIN ) tablet 500 mg (500 mg Oral Given 09/30/23 1453)                                   Medical Decision Making Risk Prescription drug management.   This patient presents to the ED for concern of back pain, this involves an extensive number of treatment options, and is a complaint that carries with it a high risk of complications and morbidity.  Differential diagnosis includes: cauda equina, osteomyelitis, malignancy, lumbar strain, vertebral fracture, epidural abscess.    Medicines ordered and  prescription drug management:  I ordered medication including  Medications  ketorolac  (TORADOL ) 15 MG/ML injection 15 mg (15 mg Intramuscular Given 09/30/23 1452)  methocarbamol  (ROBAXIN ) tablet 500 mg (500 mg Oral Given 09/30/23 1453)   for pain management. Reevaluation of the patient after these medicines showed that the patient improved I have reviewed the patients home medicines and have made adjustments as needed   Problem List / ED Course:     ICD-10-CM   1. Strain of lumbar region, initial encounter  S39.012A       MDM: Patient reports lower back pain x3 days that came on suddenly without trauma. No indication for MRI because he is afebrile, has no weight loss, appetite change or prior history of malignancy. Patient is not an IVDU so no concern for infectious cause of back pain. Neuro exam was grossly normal with equal leg strength bilaterally. Patient able to ambulate normally. No evidence of infection. No weakness. This is likely a lumbar strain. Patient given toradol  and robaxin  for pain control. Patient agreeable to plan.   Dispostion:  After consideration of the diagnostic results and the patients response to treatment, I feel that the patient would benefit from discharge and continued pain management outpatient.    Final diagnoses:  Strain of lumbar region, initial encounter    ED Discharge Orders          Ordered    methocarbamol  (ROBAXIN ) 500 MG tablet  2 times daily        09/30/23 1436    ibuprofen  (ADVIL ) 400 MG tablet  Every 6 hours PRN        09/30/23 1436               Ronesha Heenan, Marry GORMAN RIGGERS 09/30/23 1512    Cleotilde Rogue, MD 10/01/23 [phone]

## 2023-10-01 ENCOUNTER — Emergency Department (HOSPITAL_COMMUNITY)

## 2023-10-01 ENCOUNTER — Emergency Department (HOSPITAL_COMMUNITY)
Admission: EM | Admit: 2023-10-01 | Discharge: 2023-10-01 | Disposition: A | Discharge status: Home / Self Care | Attending: Emergency Medicine | Admitting: Emergency Medicine

## 2023-10-01 ENCOUNTER — Other Ambulatory Visit: Payer: Self-pay

## 2023-10-01 ENCOUNTER — Encounter (HOSPITAL_COMMUNITY): Payer: Self-pay

## 2023-10-01 DIAGNOSIS — M5442 Lumbago with sciatica, left side: Secondary | ICD-10-CM | POA: Insufficient documentation

## 2023-10-01 MED ORDER — HYDROCODONE-ACETAMINOPHEN 5-325 MG PO TABS: 1.0000 | ORAL_TABLET | ORAL | 0 refills | Status: AC | PRN

## 2023-10-01 NOTE — Discharge Instructions (Signed)
 Continue the ibuprofen  and Robaxin  provided when seen yesterday. You can take the Norco provided today as needed for severe pain.   As we discussed, it is recommended that you become established with a primary care provider for routine health, and for further management if left low back pain persists.

## 2023-10-01 NOTE — ED Triage Notes (Signed)
 Pt here yesterday and given muscle relaxers for pain that is going down his left leg. Pt back today stating that his pelvic bone is sticking out and the pain has not subsided so he wants xrays done.

## 2023-10-01 NOTE — ED Provider Notes (Signed)
 [place] EMERGENCY DEPARTMENT AT [facility] Provider Note   CSN: [id number] Arrival date & time: 10/01/23  1518     Patient presents with: Multiple Complaints   Hayden Anderson is a 58 y.o. male.   Patient returns to the ED for further evaluation of left lower back pain. Seen yesterday for same. No history of trauma or injury to the bac. Pain has been persistent for the past 4 days. It radiates to the left leg laterally. No weakness or numbness. No bowel/bladder issues. No fever. No abdominal pain. He states he was prescribed a muscle relaxer and has been taking Tylenol  without relief. He also reports a concern that the left pelvic bone feels different than the right.   The history is provided by the patient. No language interpreter was used.       Prior to Admission medications   Medication Sig Start Date End Date Taking? Authorizing Provider  HYDROcodone -acetaminophen  (NORCO/VICODIN) 5-325 MG tablet Take 1 tablet by mouth every 4 (four) hours as needed. 10/01/23  Yes Melvina Pangelinan, Margit, PA-C  diclofenac  Sodium (VOLTAREN ) 1 % GEL Apply 2 g topically 4 (four) times daily as needed. 03/04/20   Long, Joshua G, MD  erythromycin  ophthalmic ointment Place a 1/2 inch ribbon of ointment into the lower eyelid of both eyes 3 times a day for 1 week. 06/01/19   Zackowski, Scott, MD  ibuprofen  (ADVIL ) 400 MG tablet Take 1 tablet (400 mg total) by mouth every 6 (six) hours as needed. 09/30/23   Dufour, Marry RAMAN, PA-C  lidocaine  (LIDODERM ) 5 % Place 1 patch onto the skin daily. Remove & Discard patch within 12 hours or as directed by MD 03/27/18   Donah Penne LABOR, PA-C  methocarbamol  (ROBAXIN ) 500 MG tablet Take 1 tablet (500 mg total) by mouth 2 (two) times daily. 09/30/23   Dufour, Marry RAMAN, PA-C  naphazoline-pheniramine (NAPHCON-A) 0.025-0.3 % ophthalmic solution Place 1 drop into both eyes 4 (four) times daily as needed for eye irritation or allergies. 06/01/19   Zackowski, Scott, MD   naproxen  (NAPROSYN ) 500 MG tablet Take 1 tablet (500 mg total) by mouth 2 (two) times daily with a meal. 02/08/20   Brenna Lin, MD    Allergies: Patient has no known allergies.    Review of Systems  Updated Vital Signs BP (!) 155/97 (BP Location: Right Arm)   Pulse 75   Temp 97.9 F (36.6 C) (Oral)   Resp 18   Ht 6' 2 (1.88 m)   Wt 90 kg   SpO2 97%   BMI 25.47 kg/m   Physical Exam Vitals and nursing note reviewed.  Constitutional:      Appearance: He is well-developed.  Pulmonary:     Effort: Pulmonary effort is normal.  Musculoskeletal:        General: Normal range of motion.     Cervical back: Normal range of motion.       Back:     Comments: No palpable bony deformities and asymmetry. No swelling. No midline tenderness of the back.   Skin:    General: Skin is warm and dry.  Neurological:     Mental Status: He is alert and oriented to person, place, and time.     (all labs ordered are listed, but only abnormal results are displayed) Labs Reviewed - No data to display  EKG: None  Radiology: DG Pelvis 1-2 Views Result Date: 10/01/2023 CLINICAL DATA:  Pain radiating down left leg. EXAM: PELVIS - 1-2  VIEW COMPARISON:  None Available. 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 No acute fracture or dislocation. No focal lytic or sclerotic lesion. Mild degenerate change of the spine. IMPRESSION: 1. No acute findings. 2. 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 Electronically Signed   By: Toribio Agreste M.D.   On: 10/01/2023 16:25     Procedures   Medications Ordered in the ED - No data to display  Clinical Course as of 10/01/23 1706  Fri Oct 01, 2023  1704 Patient seen yesterday for low back pain that is acute. Returns today because medications provided did not help his pain and he feels there is a discrepancy between the right and left bony pelvis. Xray is negative. Exam finds no neurologic deficits. Symptoms c/w uncomplicated  sciatica. Encouraged finding a PCP. #8 Norco provided. [SU]    Clinical Course User Index [SU] Odell Balls, PA-C                                 Medical Decision Making Risk Prescription drug management.        Final diagnoses:  Acute left-sided low back pain with left-sided sciatica    ED Discharge Orders          Ordered    HYDROcodone -acetaminophen  (NORCO/VICODIN) 5-325 MG tablet  Every 4 hours PRN        10/01/23 1704               Odell Balls, PA-C 10/01/23 1706    Towana Ozell BROCKS, MD 10/02/23 [phone]
